# Patient Record
Sex: Female | Born: 1940 | Race: Asian | Hispanic: No | Marital: Married | Smoking: Never smoker
Health system: Southern US, Community
[De-identification: ages and names within clinical notes are randomized; demographics above are authoritative.]

## PROBLEM LIST (undated history)

## (undated) DIAGNOSIS — K219 Gastro-esophageal reflux disease without esophagitis: Secondary | ICD-10-CM

## (undated) DIAGNOSIS — K2970 Gastritis, unspecified, without bleeding: Secondary | ICD-10-CM

## (undated) DIAGNOSIS — M1990 Unspecified osteoarthritis, unspecified site: Secondary | ICD-10-CM

## (undated) DIAGNOSIS — E538 Deficiency of other specified B group vitamins: Secondary | ICD-10-CM

## (undated) HISTORY — PX: ESOPHAGOGASTRODUODENOSCOPY ENDOSCOPY: SHX5814

---

## 1999-10-14 ENCOUNTER — Other Ambulatory Visit: Admission: RE | Admit: 1999-10-14 | Discharge: 1999-10-14 | Payer: Self-pay | Admitting: Obstetrics and Gynecology

## 2007-11-15 ENCOUNTER — Encounter: Admission: RE | Admit: 2007-11-15 | Discharge: 2007-11-15 | Payer: Self-pay | Admitting: Orthopedic Surgery

## 2008-01-27 ENCOUNTER — Encounter: Admission: RE | Admit: 2008-01-27 | Discharge: 2008-01-27 | Payer: Self-pay | Admitting: Internal Medicine

## 2008-04-13 ENCOUNTER — Ambulatory Visit (HOSPITAL_COMMUNITY): Admission: RE | Admit: 2008-04-13 | Discharge: 2008-04-13 | Payer: Self-pay | Admitting: *Deleted

## 2010-08-12 NOTE — Op Note (Signed)
[name], Natalie Mcfarland                 ACCOUNT NO.:  [id number]   MEDICAL RECORD NO.:  [id number]          PATIENT TYPE:  AMB   LOCATION:  ENDO                         FACILITY:  [facility]   PHYSICIAN:  Georgiana Spinner, M.D.    DATE OF BIRTH:  20-Oct-1940   DATE OF PROCEDURE:  04/13/2008  DATE OF DISCHARGE:                               OPERATIVE REPORT   Dr. working on the along the line of U N G T O N G Jul 30[birth year] 1942, 04/04.   PROCEDURE:  Upper endoscopy.   INDICATIONS:  Gastroesophageal reflux disease.   ANESTHESIA:  Fentanyl 50 mcg, Versed 5 mg.   PROCEDURE:  With the patient mildly sedated in the left lateral  decubitus position, the Pentax videoscopic endoscope was inserted into  the mouth, passed under direct vision through the esophagus which  appeared normal, into the stomach.  Fundus, body, antrum appeared  normal, as did duodenal bulb, second portion of duodenum.  From this  point the endoscope was slowly withdrawn, taking circumferential views  of duodenal mucosa until the endoscope was pulled back into stomach,  placed in retroflexion to view the stomach from below.  The endoscope  was straightened and withdrawn, taking circumferential views of  remaining gastric and esophageal mucosa.  The patient's vital signs,  pulse oximeter remained stable.  The patient tolerated procedure well  without apparent complications.   FINDINGS:  Normal examination.   PLAN:  Proceed to colonoscopy.           ______________________________  Georgiana Spinner, M.D.     GMO/MEDQ  D:  04/13/2008  T:  04/13/2008  Job:  [id number]

## 2010-08-12 NOTE — Op Note (Signed)
Natalie Mcfarland, Natalie Mcfarland                 ACCOUNT NO.:  [id number]   MEDICAL RECORD NO.:  [id number]          PATIENT TYPE:  AMB   LOCATION:  ENDO                         FACILITY:  [facility]   PHYSICIAN:  Georgiana Spinner, M.D.    DATE OF BIRTH:  1940-07-02   DATE OF PROCEDURE:  04/13/2008  DATE OF DISCHARGE:                               OPERATIVE REPORT   PROCEDURE:  Colonoscopy.   INDICATIONS:  Colon cancer screening.   ANESTHESIA:  Fentanyl 15 mcg, Versed 1 mg.   PROCEDURE:  With the patient mildly sedated in the left lateral  decubitus position, the Pentax videoscopic pediatric colonoscope was  inserted into the rectum, passed under direct vision to the cecum with  pressure applied and patient rolled to her back.  The cecum was  identified by ileocecal valve and appendiceal orifice.  We entered into  the ileocecal valve and saw the terminal ileum, photographed this.  The  endoscope was withdrawn, taking circumferential views of remaining  colonic mucosa, stopping only in the rectum which appeared normal on  direct and showed hemorrhoids on retroflexed view.  The endoscope was  straightened and withdrawn.  The patient's vital signs, pulse oximeter  remained stable.  The patient tolerated procedure well, without apparent  complications.   FINDINGS:  Internal hemorrhoids.  Otherwise an unremarkable colonoscopic  examination, including terminal ileum.   PLAN:  Have patient follow up with me in 5 years or as needed.           ______________________________  Georgiana Spinner, M.D.     GMO/MEDQ  D:  04/13/2008  T:  04/13/2008  Job:  [id number]

## 2010-11-14 ENCOUNTER — Encounter: Payer: Self-pay | Admitting: Oncology

## 2010-11-17 ENCOUNTER — Other Ambulatory Visit: Payer: Self-pay | Admitting: Gastroenterology

## 2010-11-17 DIAGNOSIS — R1033 Periumbilical pain: Secondary | ICD-10-CM

## 2010-11-25 ENCOUNTER — Other Ambulatory Visit: Payer: Self-pay

## 2010-11-25 ENCOUNTER — Ambulatory Visit
Admission: RE | Admit: 2010-11-25 | Discharge: 2010-11-25 | Disposition: A | Discharge status: Home / Self Care | Payer: Medicare Other | Source: Ambulatory Visit | Attending: Gastroenterology | Admitting: Gastroenterology

## 2010-11-25 DIAGNOSIS — R1033 Periumbilical pain: Secondary | ICD-10-CM

## 2010-11-25 MED ORDER — IOHEXOL 300 MG/ML  SOLN
100.0000 mL | Freq: Once | INTRAMUSCULAR | Status: AC | PRN
  Administered 2010-11-25: 100 mL via INTRAVENOUS

## 2011-01-02 ENCOUNTER — Other Ambulatory Visit: Payer: Self-pay | Admitting: Gastroenterology

## 2011-01-02 ENCOUNTER — Ambulatory Visit (HOSPITAL_COMMUNITY)
Admission: RE | Admit: 2011-01-02 | Discharge: 2011-01-02 | Disposition: A | Discharge status: Home / Self Care | Payer: Medicare Other | Source: Ambulatory Visit | Attending: Gastroenterology | Admitting: Gastroenterology

## 2011-01-02 DIAGNOSIS — R935 Abnormal findings on diagnostic imaging of other abdominal regions, including retroperitoneum: Secondary | ICD-10-CM | POA: Insufficient documentation

## 2011-01-02 DIAGNOSIS — K319 Disease of stomach and duodenum, unspecified: Secondary | ICD-10-CM | POA: Insufficient documentation

## 2011-01-02 DIAGNOSIS — K2940 Chronic atrophic gastritis without bleeding: Secondary | ICD-10-CM | POA: Insufficient documentation

## 2011-01-02 DIAGNOSIS — C8589 Other specified types of non-Hodgkin lymphoma, extranodal and solid organ sites: Secondary | ICD-10-CM | POA: Insufficient documentation

## 2012-07-08 ENCOUNTER — Encounter (HOSPITAL_COMMUNITY): Payer: Self-pay | Admitting: Pharmacy Technician

## 2012-07-08 ENCOUNTER — Encounter (HOSPITAL_COMMUNITY): Payer: Self-pay

## 2012-07-08 ENCOUNTER — Encounter (HOSPITAL_COMMUNITY)
Admission: RE | Admit: 2012-07-08 | Discharge: 2012-07-08 | Disposition: A | Discharge status: Home / Self Care | Payer: Medicare PPO | Source: Ambulatory Visit | Attending: Orthopedic Surgery | Admitting: Orthopedic Surgery

## 2012-07-08 ENCOUNTER — Ambulatory Visit (HOSPITAL_COMMUNITY)
Admission: RE | Admit: 2012-07-08 | Discharge: 2012-07-08 | Disposition: A | Discharge status: Home / Self Care | Payer: Medicare PPO | Source: Ambulatory Visit | Attending: Orthopedic Surgery | Admitting: Orthopedic Surgery

## 2012-07-08 DIAGNOSIS — M1710 Unilateral primary osteoarthritis, unspecified knee: Secondary | ICD-10-CM | POA: Insufficient documentation

## 2012-07-08 DIAGNOSIS — Z01810 Encounter for preprocedural cardiovascular examination: Secondary | ICD-10-CM | POA: Insufficient documentation

## 2012-07-08 DIAGNOSIS — Z01811 Encounter for preprocedural respiratory examination: Secondary | ICD-10-CM | POA: Insufficient documentation

## 2012-07-08 DIAGNOSIS — Z01812 Encounter for preprocedural laboratory examination: Secondary | ICD-10-CM | POA: Insufficient documentation

## 2012-07-08 HISTORY — DX: Gastro-esophageal reflux disease without esophagitis: K21.9

## 2012-07-08 HISTORY — DX: Unspecified osteoarthritis, unspecified site: M19.90

## 2012-07-08 HISTORY — DX: Gastritis, unspecified, without bleeding: K29.70

## 2012-07-08 LAB — COMPREHENSIVE METABOLIC PANEL
ALT: 26 U/L (ref 0–35)
AST: 30 U/L (ref 0–37)
Albumin: 3.6 g/dL (ref 3.5–5.2)
Alkaline Phosphatase: 64 U/L (ref 39–117)
BUN: 12 mg/dL (ref 6–23)
Calcium: 9.2 mg/dL (ref 8.4–10.5)
Chloride: 104 mEq/L (ref 96–112)
GFR calc non Af Amer: >90 mL/min (ref >90)
Potassium: 4.2 mEq/L (ref 3.5–5.1)
Total Protein: 7.2 g/dL (ref 6.0–8.3)

## 2012-07-08 LAB — CBC
HCT: 37.4 % (ref 36.0–46.0)
Hemoglobin: 12.4 g/dL (ref 12.0–15.0)
MCH: 29.5 pg (ref 26.0–34.0)
MCV: 89 fL (ref 78.0–100.0)
RDW: 13.4 % (ref 11.5–15.5)
WBC: 5 10*3/uL (ref 4.0–10.5)

## 2012-07-08 NOTE — Patient Instructions (Signed)
YOUR SURGERY IS SCHEDULED AT [facility]  ON:  Monday  4/28  REPORT TO [place] [facility] AT:  7:30 AM      PHONE # FOR SHORT STAY IS [phone]  DO NOT EAT OR DRINK ANYTHING AFTER MIDNIGHT THE NIGHT BEFORE YOUR SURGERY.  YOU MAY BRUSH YOUR TEETH, RINSE OUT YOUR MOUTH--BUT NO WATER, NO FOOD, NO CHEWING GUM, NO MINTS, NO CANDIES, NO CHEWING TOBACCO.  PLEASE TAKE THE FOLLOWING MEDICATIONS THE AM OF YOUR SURGERY WITH A FEW SIPS OF WATER:  CLARITIN   DO NOT BRING VALUABLES, MONEY, CREDIT CARDS.  DO NOT WEAR JEWELRY, MAKE-UP, NAIL POLISH AND NO METAL PINS OR CLIPS IN YOUR HAIR. CONTACT LENS, DENTURES / PARTIALS, GLASSES SHOULD NOT BE WORN TO SURGERY AND IN MOST CASES-HEARING AIDS WILL NEED TO BE REMOVED.  BRING YOUR GLASSES CASE, ANY EQUIPMENT NEEDED FOR YOUR CONTACT LENS. FOR PATIENTS ADMITTED TO THE HOSPITAL--CHECK OUT TIME THE DAY OF DISCHARGE IS 11:00 AM.  ALL INPATIENT ROOMS ARE PRIVATE - WITH BATHROOM, TELEPHONE, TELEVISION AND WIFI INTERNET.                                PLEASE READ OVER ANY  FACT SHEETS THAT YOU WERE GIVEN: MRSA INFORMATION, BLOOD TRANSFUSION INFORMATION, INCENTIVE SPIROMETER INFORMATION. FAILURE TO FOLLOW THESE INSTRUCTIONS MAY RESULT IN THE CANCELLATION OF YOUR SURGERY.   PATIENT SIGNATURE_________________________________

## 2012-07-08 NOTE — Pre-Procedure Instructions (Addendum)
PT STATES SHE HAS NOT HAD EKG OR CXR DONE IN YEARS--SHE IS HAVING TOTAL KNEE REPLACMENT AND IS 72 YEARS OF AGE --CXR AND EKG WERE DONE TODAY PREOP. NO PREOP ORDERS IN EPIC- SPOKE WITH DR. Lequita Halt - OK TO DO CBC, CMET, PT, PTT, UA TODAY AND ASK DREW PERKINS TO PUT IN OTHER PREOP ORDERS.  DREW PAGED - NO ANSWER - WILL SEND FAXED REQUEST TO OFFICE NEXT WEEK.  PT UNDERSTANDS SHE WILL SIGN CONSENTS FOR SURGERY AND BLOOD - THE DAY OF SURGERY.

## 2012-07-14 ENCOUNTER — Other Ambulatory Visit: Payer: Self-pay | Admitting: Orthopedic Surgery

## 2012-07-14 MED ORDER — BUPIVACAINE LIPOSOME 1.3 % IJ SUSP: 20.0000 mL | Freq: Once | INTRAMUSCULAR | Status: DC

## 2012-07-14 MED ORDER — DEXAMETHASONE SODIUM PHOSPHATE 10 MG/ML IJ SOLN: 10.0000 mg | Freq: Once | INTRAMUSCULAR | Status: DC

## 2012-07-14 NOTE — Progress Notes (Signed)
Preoperative surgical orders have been place into the Epic hospital system for Natalie Mcfarland on 07/14/2012, 12:46 PM  by Patrica Duel for surgery on 07/25/2012.  Preop Total Knee orders including Experal, IV Tylenol, and IV Decadron as long as there are no contraindications to the above medications. Avel Peace, PA-C

## 2012-07-24 ENCOUNTER — Other Ambulatory Visit: Payer: Self-pay | Admitting: Surgical

## 2012-07-24 NOTE — H&P (Signed)
TOTAL KNEE ADMISSION H&P  Patient is being admitted for left total knee arthroplasty.  Subjective:  Chief Complaint:left knee pain.  HPI: Natalie Mcfarland, 72 y.o. female, has a history of pain and functional disability in the left knee due to arthritis and has failed non-surgical conservative treatments for greater than 12 weeks to includeNSAID's and/or analgesics, corticosteriod injections, viscosupplementation injections and activity modification.  Onset of symptoms was gradual, starting >10 years ago with gradually worsening course since that time. The patient noted no past surgery on the left knee(s).  Patient currently rates pain in the left knee(s) at 7 out of 10 with activity. Patient has night pain, worsening of pain with activity and weight bearing, pain that interferes with activities of daily living and crepitus.  Patient has evidence of periarticular osteophytes and joint space narrowing by imaging studies.  There is no active infection.   Past Medical History  Diagnosis Date  . Gastritis     past hx of gastritis -no problem since  . GERD (gastroesophageal reflux disease)   . Arthritis     pain and oa left knee    Past Surgical History  Procedure Laterality Date  . Esophagogastroduodenoscopy endoscopy       Current outpatient prescriptions:fish oil-omega-3 fatty acids 1000 MG capsule, Take 1 g by mouth daily., Disp: , Rfl: ;  ibuprofen (ADVIL,MOTRIN) 200 MG tablet, Take 400 mg by mouth every 6 (six) hours as needed for pain or headache., Disp: , Rfl: ;  loratadine (CLARITIN) 10 MG tablet, Take 10 mg by mouth daily., Disp: , Rfl:   No Known Allergies  History  Substance Use Topics  . Smoking status: Not on file  . Smokeless tobacco: Not on file  . Alcohol Use: Not on file    No pertinent family medical history  Review of Systems  Constitutional: Negative.   HENT: Negative.  Negative for neck pain.   Eyes: Positive for blurred vision. Negative for double vision,  photophobia, pain, discharge and redness.  Respiratory: Negative.   Cardiovascular: Negative.   Gastrointestinal: Negative.   Genitourinary: Negative.   Musculoskeletal: Positive for joint pain. Negative for myalgias, back pain and falls.       Left knee pain  Skin: Positive for itching and rash.  Neurological: Negative.   Endo/Heme/Allergies: Positive for environmental allergies. Negative for polydipsia. Does not bruise/bleed easily.  Psychiatric/Behavioral: Negative.     Objective:  Physical Exam  Constitutional: She is oriented to person, place, and time. She appears well-developed and well-nourished. No distress.  HENT:  Head: Normocephalic and atraumatic.  Right Ear: External ear normal.  Left Ear: External ear normal.  Nose: Nose normal.  Mouth/Throat: Oropharynx is clear and moist.  Eyes: Conjunctivae and EOM are normal.  Neck: Normal range of motion. Neck supple. No tracheal deviation present. No thyromegaly present.  Cardiovascular: Normal rate, regular rhythm, normal heart sounds and intact distal pulses.   No murmur heard. Respiratory: Effort normal and breath sounds normal. No respiratory distress. She has no wheezes. She exhibits no tenderness.  GI: Soft. Bowel sounds are normal. She exhibits no distension and no mass. There is no tenderness.  Musculoskeletal:       Right hip: Normal.       Left hip: Normal.       Right knee: Normal.       Left knee: She exhibits decreased range of motion, effusion and abnormal alignment. She exhibits no erythema. Tenderness found. Medial joint line and lateral joint line tenderness  noted.       Right lower leg: She exhibits no tenderness and no swelling.       Left lower leg: She exhibits no tenderness and no swelling.  The right knee shows no effusion. Range of motion is 0-125. Slight crepitus on range of motion. No joint line tenderness or instability is noted. The left knee shows a varus deformity. Slight effusion. Range is 5-125.  Marked crepitus on range of motion. Significant tenderness medial greater than lateral with no instability.  Lymphadenopathy:    She has no cervical adenopathy.  Neurological: She is alert and oriented to person, place, and time. She has normal strength and normal reflexes. No sensory deficit.  Skin: No rash noted. She is not diaphoretic. No erythema.  Psychiatric: She has a normal mood and affect. Her behavior is normal.    Vitals Weight: 171 lb Height: 62 in Body Surface Area: 1.84 m Body Mass Index: 31.28 kg/m Pulse: 66 (Regular) BP: 124/78 (Sitting, Left Arm, Standard)  Imaging Review Plain radiographs demonstrate severe degenerative joint disease of the left knee(s). The overall alignment ismild varus. The bone quality appears to be good for age and reported activity level.  Assessment/Plan:  End stage arthritis, left knee   The patient history, physical examination, clinical judgment of the provider and imaging studies are consistent with end stage degenerative joint disease of the left knee(s) and total knee arthroplasty is deemed medically necessary. The treatment options including medical management, injection therapy arthroscopy and arthroplasty were discussed at length. The risks and benefits of total knee arthroplasty were presented and reviewed. The risks due to aseptic loosening, infection, stiffness, patella tracking problems, thromboembolic complications and other imponderables were discussed. The patient acknowledged the explanation, agreed to proceed with the plan and consent was signed. Patient is being admitted for inpatient treatment for surgery, pain control, PT, OT, prophylactic antibiotics, VTE prophylaxis, progressive ambulation and ADL's and discharge planning. The patient is planning to be discharged to skilled nursing facility     [place], [place]

## 2012-07-25 ENCOUNTER — Encounter (HOSPITAL_COMMUNITY)
Admission: RE | Disposition: A | Discharge status: Home Health | Payer: Self-pay | Source: Ambulatory Visit | Attending: Orthopedic Surgery

## 2012-07-25 ENCOUNTER — Inpatient Hospital Stay (HOSPITAL_COMMUNITY): Payer: Medicare PPO | Admitting: Anesthesiology

## 2012-07-25 ENCOUNTER — Encounter (HOSPITAL_COMMUNITY): Payer: Self-pay | Admitting: Anesthesiology

## 2012-07-25 ENCOUNTER — Inpatient Hospital Stay (HOSPITAL_COMMUNITY)
Admission: RE | Admit: 2012-07-25 | Discharge: 2012-07-28 | DRG: 470 | Disposition: A | Discharge status: Home Health | Payer: Medicare PPO | Source: Ambulatory Visit | Attending: Orthopedic Surgery | Admitting: Orthopedic Surgery

## 2012-07-25 ENCOUNTER — Encounter (HOSPITAL_COMMUNITY): Payer: Self-pay | Admitting: *Deleted

## 2012-07-25 DIAGNOSIS — Z96652 Presence of left artificial knee joint: Secondary | ICD-10-CM

## 2012-07-25 DIAGNOSIS — K219 Gastro-esophageal reflux disease without esophagitis: Secondary | ICD-10-CM | POA: Diagnosis present

## 2012-07-25 DIAGNOSIS — M1710 Unilateral primary osteoarthritis, unspecified knee: Principal | ICD-10-CM | POA: Diagnosis present

## 2012-07-25 DIAGNOSIS — E876 Hypokalemia: Secondary | ICD-10-CM

## 2012-07-25 DIAGNOSIS — Z79899 Other long term (current) drug therapy: Secondary | ICD-10-CM

## 2012-07-25 HISTORY — PX: TOTAL KNEE ARTHROPLASTY: SHX125

## 2012-07-25 LAB — URINALYSIS, ROUTINE W REFLEX MICROSCOPIC
Bilirubin Urine: NEGATIVE
Ketones, ur: NEGATIVE mg/dL
Nitrite: NEGATIVE
Urobilinogen, UA: 0.2 mg/dL (ref 0.0–1.0)
pH: 5.5 (ref 5.0–8.0)

## 2012-07-25 LAB — URINE MICROSCOPIC-ADD ON

## 2012-07-25 LAB — TYPE AND SCREEN

## 2012-07-25 SURGERY — ARTHROPLASTY, KNEE, TOTAL
Anesthesia: Spinal | Site: Knee | Laterality: Left | Wound class: Clean

## 2012-07-25 MED ORDER — OXYCODONE HCL 5 MG PO TABS: 5.0000 mg | ORAL_TABLET | Freq: Once | ORAL | Status: DC | PRN

## 2012-07-25 MED ORDER — PROPOFOL 10 MG/ML IV EMUL
INTRAVENOUS | Status: DC | PRN
  Administered 2012-07-25: 100 ug/kg/min via INTRAVENOUS

## 2012-07-25 MED ORDER — ONDANSETRON HCL 4 MG/2ML IJ SOLN
INTRAMUSCULAR | Status: DC | PRN
  Administered 2012-07-25: 4 mg via INTRAVENOUS

## 2012-07-25 MED ORDER — DEXAMETHASONE 6 MG PO TABS
10.0000 mg | ORAL_TABLET | Freq: Every day | ORAL | Status: AC
  Administered 2012-07-26: 10 mg via ORAL
  Filled 2012-07-25: qty 1

## 2012-07-25 MED ORDER — BUPIVACAINE IN DEXTROSE 0.75-8.25 % IT SOLN
INTRATHECAL | Status: DC | PRN
  Administered 2012-07-25: 1.5 mL via INTRATHECAL

## 2012-07-25 MED ORDER — ACETAMINOPHEN 10 MG/ML IV SOLN
1000.0000 mg | Freq: Four times a day (QID) | INTRAVENOUS | Status: AC
  Administered 2012-07-25 – 2012-07-26 (×4): 1000 mg via INTRAVENOUS
  Filled 2012-07-25 (×6): qty 100

## 2012-07-25 MED ORDER — LACTATED RINGERS IV SOLN
INTRAVENOUS | Status: DC | PRN
  Administered 2012-07-25 (×2): via INTRAVENOUS

## 2012-07-25 MED ORDER — DIPHENHYDRAMINE HCL 12.5 MG/5ML PO ELIX: 12.5000 mg | ORAL_SOLUTION | ORAL | Status: DC | PRN

## 2012-07-25 MED ORDER — DEXAMETHASONE SODIUM PHOSPHATE 10 MG/ML IJ SOLN
10.0000 mg | Freq: Every day | INTRAMUSCULAR | Status: AC
  Filled 2012-07-25: qty 1

## 2012-07-25 MED ORDER — ACETAMINOPHEN 10 MG/ML IV SOLN: 1000.0000 mg | Freq: Once | INTRAVENOUS | Status: DC | PRN

## 2012-07-25 MED ORDER — CEFAZOLIN SODIUM-DEXTROSE 2-3 GM-% IV SOLR
2.0000 g | INTRAVENOUS | Status: AC
  Administered 2012-07-25: 2 g via INTRAVENOUS

## 2012-07-25 MED ORDER — LACTATED RINGERS IV SOLN
INTRAVENOUS | Status: DC
  Administered 2012-07-25: 13:00:00 via INTRAVENOUS

## 2012-07-25 MED ORDER — ACETAMINOPHEN 650 MG RE SUPP: 650.0000 mg | Freq: Four times a day (QID) | RECTAL | Status: DC | PRN

## 2012-07-25 MED ORDER — SODIUM CHLORIDE 0.9 % IJ SOLN
INTRAMUSCULAR | Status: DC | PRN
  Administered 2012-07-25: 11:00:00

## 2012-07-25 MED ORDER — OXYCODONE HCL 5 MG PO TABS
5.0000 mg | ORAL_TABLET | ORAL | Status: DC | PRN
  Administered 2012-07-25: 10 mg via ORAL
  Administered 2012-07-26: 5 mg via ORAL
  Administered 2012-07-26 – 2012-07-28 (×9): 10 mg via ORAL
  Filled 2012-07-25 (×3): qty 2
  Filled 2012-07-25: qty 1
  Filled 2012-07-25 (×7): qty 2

## 2012-07-25 MED ORDER — PHENOL 1.4 % MT LIQD: 1.0000 | OROMUCOSAL | Status: DC | PRN

## 2012-07-25 MED ORDER — FLEET ENEMA 7-19 GM/118ML RE ENEM: 1.0000 | ENEMA | Freq: Once | RECTAL | Status: AC | PRN

## 2012-07-25 MED ORDER — PROMETHAZINE HCL 25 MG/ML IJ SOLN: 6.2500 mg | INTRAMUSCULAR | Status: DC | PRN

## 2012-07-25 MED ORDER — MORPHINE SULFATE 2 MG/ML IJ SOLN: 1.0000 mg | INTRAMUSCULAR | Status: DC | PRN

## 2012-07-25 MED ORDER — TRAMADOL HCL 50 MG PO TABS
50.0000 mg | ORAL_TABLET | Freq: Four times a day (QID) | ORAL | Status: DC | PRN
  Administered 2012-07-25: 100 mg via ORAL
  Filled 2012-07-25: qty 2

## 2012-07-25 MED ORDER — HYDROMORPHONE HCL PF 1 MG/ML IJ SOLN: 0.2500 mg | INTRAMUSCULAR | Status: DC | PRN

## 2012-07-25 MED ORDER — POLYETHYLENE GLYCOL 3350 17 G PO PACK: 17.0000 g | PACK | Freq: Every day | ORAL | Status: DC | PRN

## 2012-07-25 MED ORDER — METOCLOPRAMIDE HCL 10 MG PO TABS: 5.0000 mg | ORAL_TABLET | Freq: Three times a day (TID) | ORAL | Status: DC | PRN

## 2012-07-25 MED ORDER — ONDANSETRON HCL 4 MG PO TABS: 4.0000 mg | ORAL_TABLET | Freq: Four times a day (QID) | ORAL | Status: DC | PRN

## 2012-07-25 MED ORDER — METHOCARBAMOL 500 MG PO TABS
500.0000 mg | ORAL_TABLET | Freq: Four times a day (QID) | ORAL | Status: DC | PRN
  Administered 2012-07-25 – 2012-07-28 (×4): 500 mg via ORAL
  Filled 2012-07-25 (×4): qty 1

## 2012-07-25 MED ORDER — FENTANYL CITRATE 0.05 MG/ML IJ SOLN
INTRAMUSCULAR | Status: DC | PRN
  Administered 2012-07-25: 100 ug via INTRAVENOUS

## 2012-07-25 MED ORDER — BUPIVACAINE LIPOSOME 1.3 % IJ SUSP
20.0000 mL | Freq: Once | INTRAMUSCULAR | Status: DC
  Filled 2012-07-25: qty 20

## 2012-07-25 MED ORDER — SODIUM CHLORIDE 0.9 % IR SOLN
Status: DC | PRN
  Administered 2012-07-25: 1000 mL

## 2012-07-25 MED ORDER — METHOCARBAMOL 100 MG/ML IJ SOLN: 500.0000 mg | Freq: Four times a day (QID) | INTRAVENOUS | Status: DC | PRN

## 2012-07-25 MED ORDER — METOCLOPRAMIDE HCL 5 MG/ML IJ SOLN
5.0000 mg | Freq: Three times a day (TID) | INTRAMUSCULAR | Status: DC | PRN
  Administered 2012-07-26: 10 mg via INTRAVENOUS
  Filled 2012-07-25: qty 2

## 2012-07-25 MED ORDER — DOCUSATE SODIUM 100 MG PO CAPS
100.0000 mg | ORAL_CAPSULE | Freq: Two times a day (BID) | ORAL | Status: DC
  Administered 2012-07-25 – 2012-07-28 (×5): 100 mg via ORAL

## 2012-07-25 MED ORDER — ACETAMINOPHEN 325 MG PO TABS
650.0000 mg | ORAL_TABLET | Freq: Four times a day (QID) | ORAL | Status: DC | PRN
  Administered 2012-07-27: 650 mg via ORAL
  Filled 2012-07-25: qty 2

## 2012-07-25 MED ORDER — SODIUM CHLORIDE 0.9 % IV SOLN: INTRAVENOUS | Status: DC

## 2012-07-25 MED ORDER — MIDAZOLAM HCL 5 MG/5ML IJ SOLN
INTRAMUSCULAR | Status: DC | PRN
  Administered 2012-07-25 (×2): 1 mg via INTRAVENOUS

## 2012-07-25 MED ORDER — ONDANSETRON HCL 4 MG/2ML IJ SOLN
4.0000 mg | Freq: Four times a day (QID) | INTRAMUSCULAR | Status: DC | PRN
  Administered 2012-07-26 (×2): 4 mg via INTRAVENOUS
  Filled 2012-07-25 (×2): qty 2

## 2012-07-25 MED ORDER — TRANEXAMIC ACID 100 MG/ML IV SOLN
1000.0000 mg | INTRAVENOUS | Status: AC
  Administered 2012-07-25: 1000 mg via INTRAVENOUS
  Filled 2012-07-25: qty 10

## 2012-07-25 MED ORDER — ACETAMINOPHEN 10 MG/ML IV SOLN
1000.0000 mg | Freq: Once | INTRAVENOUS | Status: AC
  Administered 2012-07-25: 1000 mg via INTRAVENOUS

## 2012-07-25 MED ORDER — OXYCODONE HCL 5 MG/5ML PO SOLN
5.0000 mg | Freq: Once | ORAL | Status: DC | PRN
  Filled 2012-07-25: qty 5

## 2012-07-25 MED ORDER — CHLORHEXIDINE GLUCONATE 4 % EX LIQD: 60.0000 mL | Freq: Once | CUTANEOUS | Status: DC

## 2012-07-25 MED ORDER — BUPIVACAINE HCL (PF) 0.25 % IJ SOLN
INTRAMUSCULAR | Status: DC | PRN
  Administered 2012-07-25: 20 mL

## 2012-07-25 MED ORDER — CEFAZOLIN SODIUM 1-5 GM-% IV SOLN
1.0000 g | Freq: Four times a day (QID) | INTRAVENOUS | Status: AC
  Administered 2012-07-25 (×2): 1 g via INTRAVENOUS
  Filled 2012-07-25 (×2): qty 50

## 2012-07-25 MED ORDER — MEPERIDINE HCL 50 MG/ML IJ SOLN: 6.2500 mg | INTRAMUSCULAR | Status: DC | PRN

## 2012-07-25 MED ORDER — BISACODYL 10 MG RE SUPP: 10.0000 mg | Freq: Every day | RECTAL | Status: DC | PRN

## 2012-07-25 MED ORDER — DEXTROSE-NACL 5-0.9 % IV SOLN
INTRAVENOUS | Status: DC
  Administered 2012-07-25: 17:00:00 via INTRAVENOUS

## 2012-07-25 MED ORDER — MENTHOL 3 MG MT LOZG: 1.0000 | LOZENGE | OROMUCOSAL | Status: DC | PRN

## 2012-07-25 MED ORDER — LORATADINE 10 MG PO TABS
10.0000 mg | ORAL_TABLET | Freq: Every day | ORAL | Status: DC
  Administered 2012-07-26 – 2012-07-28 (×3): 10 mg via ORAL
  Filled 2012-07-25 (×3): qty 1

## 2012-07-25 MED ORDER — 0.9 % SODIUM CHLORIDE (POUR BTL) OPTIME
TOPICAL | Status: DC | PRN
  Administered 2012-07-25: 1000 mL

## 2012-07-25 MED ORDER — RIVAROXABAN 10 MG PO TABS
10.0000 mg | ORAL_TABLET | Freq: Every day | ORAL | Status: DC
  Administered 2012-07-26 – 2012-07-28 (×3): 10 mg via ORAL
  Filled 2012-07-25 (×5): qty 1

## 2012-07-25 SURGICAL SUPPLY — 53 items
BAG ZIPLOCK 12X15 (MISCELLANEOUS) ×2 IMPLANT
BANDAGE ELASTIC 6 VELCRO ST LF (GAUZE/BANDAGES/DRESSINGS) ×2 IMPLANT
BANDAGE ESMARK 6X9 LF (GAUZE/BANDAGES/DRESSINGS) ×1 IMPLANT
BLADE SAG 18X100X1.27 (BLADE) ×2 IMPLANT
BLADE SAW SGTL 11.0X1.19X90.0M (BLADE) ×2 IMPLANT
BNDG ESMARK 6X9 LF (GAUZE/BANDAGES/DRESSINGS) ×2
BOWL SMART MIX CTS (DISPOSABLE) ×2 IMPLANT
CEMENT HV SMART SET (Cement) ×4 IMPLANT
CLOTH BEACON ORANGE TIMEOUT ST (SAFETY) ×2 IMPLANT
CUFF TOURN SGL QUICK 34 (TOURNIQUET CUFF) ×1
CUFF TRNQT CYL 34X4X40X1 (TOURNIQUET CUFF) ×1 IMPLANT
DRAPE EXTREMITY T 121X128X90 (DRAPE) ×2 IMPLANT
DRAPE POUCH INSTRU U-SHP 10X18 (DRAPES) ×2 IMPLANT
DRAPE U-SHAPE 47X51 STRL (DRAPES) ×2 IMPLANT
DRSG ADAPTIC 3X8 NADH LF (GAUZE/BANDAGES/DRESSINGS) ×2 IMPLANT
DRSG PAD ABDOMINAL 8X10 ST (GAUZE/BANDAGES/DRESSINGS) ×2 IMPLANT
DURAPREP 26ML APPLICATOR (WOUND CARE) ×2 IMPLANT
ELECT REM PT RETURN 9FT ADLT (ELECTROSURGICAL) ×2
ELECTRODE REM PT RTRN 9FT ADLT (ELECTROSURGICAL) ×1 IMPLANT
EVACUATOR 1/8 PVC DRAIN (DRAIN) ×2 IMPLANT
FACESHIELD LNG OPTICON STERILE (SAFETY) ×8 IMPLANT
GLOVE BIO SURGEON STRL SZ7.5 (GLOVE) ×2 IMPLANT
GLOVE BIO SURGEON STRL SZ8 (GLOVE) ×2 IMPLANT
GLOVE BIOGEL PI IND STRL 8 (GLOVE) ×2 IMPLANT
GLOVE BIOGEL PI INDICATOR 8 (GLOVE) ×2
GLOVE SURG SS PI 6.5 STRL IVOR (GLOVE) ×4 IMPLANT
GOWN STRL NON-REIN LRG LVL3 (GOWN DISPOSABLE) ×6 IMPLANT
GOWN STRL REIN XL XLG (GOWN DISPOSABLE) ×2 IMPLANT
HANDPIECE INTERPULSE COAX TIP (DISPOSABLE) ×1
IMMOBILIZER KNEE 20 (SOFTGOODS) ×2
IMMOBILIZER KNEE 20 THIGH 36 (SOFTGOODS) ×1 IMPLANT
KIT BASIN OR (CUSTOM PROCEDURE TRAY) ×2 IMPLANT
MANIFOLD NEPTUNE II (INSTRUMENTS) ×2 IMPLANT
NDL SAFETY ECLIPSE 18X1.5 (NEEDLE) ×1 IMPLANT
NEEDLE HYPO 18GX1.5 SHARP (NEEDLE) ×1
NS IRRIG 1000ML POUR BTL (IV SOLUTION) ×2 IMPLANT
PACK TOTAL JOINT (CUSTOM PROCEDURE TRAY) ×2 IMPLANT
PAD ABD 7.5X8 STRL (GAUZE/BANDAGES/DRESSINGS) ×2 IMPLANT
PADDING CAST COTTON 6X4 STRL (CAST SUPPLIES) ×4 IMPLANT
POSITIONER SURGICAL ARM (MISCELLANEOUS) ×2 IMPLANT
SET HNDPC FAN SPRY TIP SCT (DISPOSABLE) ×1 IMPLANT
SPONGE GAUZE 4X4 12PLY (GAUZE/BANDAGES/DRESSINGS) ×2 IMPLANT
STRIP CLOSURE SKIN 1/2X4 (GAUZE/BANDAGES/DRESSINGS) ×4 IMPLANT
SUCTION FRAZIER 12FR DISP (SUCTIONS) ×2 IMPLANT
SUT MNCRL AB 4-0 PS2 18 (SUTURE) ×2 IMPLANT
SUT VIC AB 2-0 CT1 27 (SUTURE) ×3
SUT VIC AB 2-0 CT1 TAPERPNT 27 (SUTURE) ×3 IMPLANT
SUT VLOC 180 0 24IN GS25 (SUTURE) ×2 IMPLANT
SYR 50ML LL SCALE MARK (SYRINGE) ×2 IMPLANT
TOWEL OR 17X26 10 PK STRL BLUE (TOWEL DISPOSABLE) ×4 IMPLANT
TRAY FOLEY CATH 14FRSI W/METER (CATHETERS) ×2 IMPLANT
WATER STERILE IRR 1500ML POUR (IV SOLUTION) ×4 IMPLANT
WRAP KNEE MAXI GEL POST OP (GAUZE/BANDAGES/DRESSINGS) ×2 IMPLANT

## 2012-07-25 NOTE — Op Note (Signed)
Pre-operative diagnosis- Osteoarthritis  Left knee(s)  Post-operative diagnosis- Osteoarthritis Left knee(s)  Procedure-  Left  Total Knee Arthroplasty  Surgeon- Natalie Mcfarland. Natalie Bradt, MD  Assistant- Natalie Peace, PA-C   Anesthesia-  Spinal EBL-* No blood loss amount entered *  Drains Hemovac  Tourniquet time-  Total Tourniquet Time Documented: Thigh (Left) - 34 minutes Total: Thigh (Left) - 34 minutes    Complications- None  Condition-PACU - hemodynamically stable.   Brief Clinical Note  Natalie Mcfarland is a 72 y.o. year old female with end stage OA of her left knee with progressively worsening pain and dysfunction. She has constant pain, with activity and at rest and significant functional deficits with difficulties even with ADLs. She has had extensive non-op management including analgesics, injections of cortisone, and home exercise program, but remains in significant pain with significant dysfunction. Radiographs show bone on bone arthritis medial and patellofemoral. She presents now for left Total Knee Arthroplasty.    Procedure in detail---   The patient is brought into the operating room and positioned supine on the operating table. After successful administration of  Spinal,   a tourniquet is placed high on the  Left thigh(s) and the lower extremity is prepped and draped in the usual sterile fashion. Time out is performed by the operating team and then the  Left lower extremity is wrapped in Esmarch, knee flexed and the tourniquet inflated to 300 mmHg.       A midline incision is made with a ten blade through the subcutaneous tissue to the level of the extensor mechanism. A fresh blade is used to make a medial parapatellar arthrotomy. Soft tissue over the proximal medial tibia is subperiosteally elevated to the joint line with a knife and into the semimembranosus bursa with a Cobb elevator. Soft tissue over the proximal lateral tibia is elevated with attention being paid to avoiding the  patellar tendon on the tibial tubercle. The patella is everted, knee flexed 90 degrees and the ACL and PCL are removed. Findings are bone on bone medial and patellofemoral with medial osteophytes.        The drill is used to create a starting hole in the distal femur and the canal is thoroughly irrigated with sterile saline to remove the fatty contents. The 5 degree Left  valgus alignment guide is placed into the femoral canal and the distal femoral cutting block is pinned to remove 10 mm off the distal femur. Resection is made with an oscillating saw.      The tibia is subluxed forward and the menisci are removed. The extramedullary alignment guide is placed referencing proximally at the medial aspect of the tibial tubercle and distally along the second metatarsal axis and tibial crest. The block is pinned to remove 2mm off the more deficient medial  side. Resection is made with an oscillating saw. Size 2is the most appropriate size for the tibia and the proximal tibia is prepared with the modular drill and keel punch for that size.      The femoral sizing guide is placed and size 2.5 is most appropriate. Rotation is marked off the epicondylar axis and confirmed by creating a rectangular flexion gap at 90 degrees. The size 2.5 cutting block is pinned in this rotation and the anterior, posterior and chamfer cuts are made with the oscillating saw. The intercondylar block is then placed and that cut is made.      Trial size 2 tibial component, trial size 2.5 posterior stabilized femur and  a 12.5  mm posterior stabilized rotating platform insert trial is placed. Full extension is achieved with excellent varus/valgus and anterior/posterior balance throughout full range of motion. The patella is everted and thickness measured to be 22  mm. Free hand resection is taken to 12 mm, a 35 template is placed, lug holes are drilled, trial patella is placed, and it tracks normally. Osteophytes are removed off the posterior  femur with the trial in place. All trials are removed and the cut bone surfaces prepared with pulsatile lavage. Cement is mixed and once ready for implantation, the size 2 tibial implant, size  2.5 posterior stabilized femoral component, and the size 35 patella are cemented in place and the patella is held with the clamp. The trial insert is placed and the knee held in full extension. The Exparel (20 ml mixed with 50 ml saline) is injected into the extensor mechanism, posterior capsule, medial and lateral gutters and subcutaneous tissues.  All extruded cement is removed and once the cement is hard the permanent 12.5 mm posterior stabilized rotating platform insert is placed into the tibial tray.      The wound is copiously irrigated with saline solution and the extensor mechanism closed over a hemovac drain with #1 PDS suture. The tourniquet is released for a total tourniquet time of 35  minutes. Flexion against gravity is 140 degrees and the patella tracks normally. Subcutaneous tissue is closed with 2.0 vicryl and subcuticular with running 4.0 Monocryl. The incision is cleaned and dried and steri-strips and a bulky sterile dressing are applied. The limb is placed into a knee immobilizer and the patient is awakened and transported to recovery in stable condition.      Please note that a surgical assistant was a medical necessity for this procedure in order to perform it in a safe and expeditious manner. Surgical assistant was necessary to retract the ligaments and vital neurovascular structures to prevent injury to them and also necessary for proper positioning of the limb to allow for anatomic placement of the prosthesis.   Natalie Mcfarland Natalie Barnard, MD    07/25/2012, 11:29 AM

## 2012-07-25 NOTE — Anesthesia Postprocedure Evaluation (Signed)
Anesthesia Post Note  Patient: Natalie Mcfarland  Procedure(s) Performed: Procedure(s) (LRB): TOTAL KNEE ARTHROPLASTY (Left)  Anesthesia type: Spinal  Patient location: PACU  Post pain: Pain level controlled  Post assessment: Post-op Vital signs reviewed  Last Vitals: BP 121/69  Pulse 50  Temp(Src) 36.3 C (Oral)  Resp 13  SpO2 100%  Post vital signs: Reviewed  Level of consciousness: sedated  Complications: No apparent anesthesia complications

## 2012-07-25 NOTE — Interval H&P Note (Signed)
History and Physical Interval Note:  07/25/2012 9:38 AM  Natalie Mcfarland  has presented today for surgery, with the diagnosis of Osteoartritis of the Left Knee  The various methods of treatment have been discussed with the patient and family. After consideration of risks, benefits and other options for treatment, the patient has consented to  Procedure(s): TOTAL KNEE ARTHROPLASTY (Left) as a surgical intervention .  The patient's history has been reviewed, patient examined, no change in status, stable for surgery.  I have reviewed the patient's chart and labs.  Questions were answered to the patient's satisfaction.     Loanne Drilling

## 2012-07-25 NOTE — Plan of Care (Signed)
Problem: Consults Goal: Diagnosis- Total Joint Replacement Left total knee     

## 2012-07-25 NOTE — Anesthesia Procedure Notes (Signed)
Spinal  Patient location during procedure: OR End time: 07/25/2012 10:32 AM Staffing CRNA/Resident: Enriqueta Shutter Performed by: resident/CRNA  Preanesthetic Checklist Completed: patient identified, site marked, surgical consent, pre-op evaluation, timeout performed, IV checked, risks and benefits discussed and monitors and equipment checked Spinal Block Patient position: sitting Prep: Betadine Patient monitoring: continuous pulse ox and blood pressure Approach: midline Location: L3-4 Injection technique: single-shot Needle Needle type: Sprotte and Pencil-Tip  Needle gauge: 24 G Needle length: 9 cm Assessment Sensory level: T6 Additional Notes Expiration date of kit checked and confirmed. Patient tolerated procedure well, without complications.

## 2012-07-25 NOTE — Anesthesia Preprocedure Evaluation (Addendum)
Anesthesia Evaluation  Patient identified by MRN, date of birth, ID band Patient awake    Reviewed: Allergy & Precautions, H&P , NPO status , Patient's Chart, lab work & pertinent test results  Airway Mallampati: II TM Distance: >3 FB Neck ROM: Full    Dental  (+) Dental Advisory Given and Teeth Intact   Pulmonary neg pulmonary ROS,  breath sounds clear to auscultation        Cardiovascular negative cardio ROS  Rhythm:Regular Rate:Normal     Neuro/Psych negative neurological ROS  negative psych ROS   GI/Hepatic Neg liver ROS, GERD-  Medicated,  Endo/Other  negative endocrine ROS  Renal/GU negative Renal ROS     Musculoskeletal negative musculoskeletal ROS (+)   Abdominal   Peds  Hematology negative hematology ROS (+)   Anesthesia Other Findings   Reproductive/Obstetrics                          Anesthesia Physical Anesthesia Plan  ASA: II  Anesthesia Plan: Spinal   Post-op Pain Management:    Induction: Intravenous  Airway Management Planned:   Additional Equipment:   Intra-op Plan:   Post-operative Plan:   Informed Consent: I have reviewed the patients History and Physical, chart, labs and discussed the procedure including the risks, benefits and alternatives for the proposed anesthesia with the patient or authorized representative who has indicated his/her understanding and acceptance.   Dental advisory given  Plan Discussed with: CRNA  Anesthesia Plan Comments:        Anesthesia Quick Evaluation

## 2012-07-25 NOTE — Progress Notes (Signed)
Utilization review completed.  

## 2012-07-25 NOTE — Transfer of Care (Signed)
Immediate Anesthesia Transfer of Care Note  Patient: Natalie Mcfarland  Procedure(s) Performed: Procedure(s): TOTAL KNEE ARTHROPLASTY (Left)  Patient Location: PACU  Anesthesia Type:Regional  Level of Consciousness: awake, alert  and oriented  Airway & Oxygen Therapy: Patient Spontanous Breathing and Patient connected to face mask oxygen  Post-op Assessment: Report given to PACU RN and Post -op Vital signs reviewed and stable  Post vital signs: Reviewed and stable  Complications: No apparent anesthesia complications

## 2012-07-26 ENCOUNTER — Encounter (HOSPITAL_COMMUNITY): Payer: Self-pay | Admitting: Orthopedic Surgery

## 2012-07-26 LAB — CBC
MCHC: 33.1 g/dL (ref 30.0–36.0)
Platelets: 172 10*3/uL (ref 150–400)
RDW: 13.5 % (ref 11.5–15.5)
WBC: 7.4 10*3/uL (ref 4.0–10.5)

## 2012-07-26 LAB — BASIC METABOLIC PANEL
Chloride: 102 mEq/L (ref 96–112)
GFR calc Af Amer: >90 mL/min (ref >90)
GFR calc non Af Amer: >90 mL/min (ref >90)
Potassium: 3.8 mEq/L (ref 3.5–5.1)
Sodium: 135 mEq/L (ref 135–145)

## 2012-07-26 MED ORDER — METHOCARBAMOL 500 MG PO TABS: 500.0000 mg | ORAL_TABLET | Freq: Four times a day (QID) | ORAL | Status: DC | PRN

## 2012-07-26 MED ORDER — RIVAROXABAN 10 MG PO TABS: 10.0000 mg | ORAL_TABLET | Freq: Every day | ORAL | Status: DC

## 2012-07-26 MED ORDER — TRAMADOL HCL 50 MG PO TABS: 50.0000 mg | ORAL_TABLET | Freq: Four times a day (QID) | ORAL | Status: DC | PRN

## 2012-07-26 MED ORDER — OXYCODONE HCL 5 MG PO TABS: 5.0000 mg | ORAL_TABLET | ORAL | Status: DC | PRN

## 2012-07-26 NOTE — Clinical Documentation Improvement (Signed)
GENERIC DOCUMENTATION CLARIFICATION QUERY  THIS DOCUMENT IS NOT A PERMANENT PART OF THE MEDICAL RECORD  TO RESPOND TO THE THIS QUERY, FOLLOW THE INSTRUCTIONS BELOW:  1. If needed, update documentation for the patient's encounter via the notes activity.  2. Access this query again and click edit on the In [employer].  3. After updating, or not, click F2 to complete all highlighted (required) fields concerning your review. Select "additional documentation in the medical record" OR "no additional documentation provided".  4. Click Sign note button.  5. The deficiency will fall out of your In Basket *Please let us know if you are not able to complete this workflow by phone or e-mail (listed below).  Please update your documentation within the medical record to reflect your response to this query.                                                                                        07/26/12   Dear Natalie Mcfarland[facility] Associates,  In a better effort to capture your patient's severity of illness, reflect appropriate length of stay and utilization of resources, a review of the patient medical record has revealed the following indicators.    Based on your clinical judgment, please clarify and document in a progress note and/or discharge summary the clinical condition associated with the following supporting information:  In responding to this query please exercise your independent judgment.  The fact that a query is asked, does not imply that any particular answer is desired or expected.  Clarification Needed   Please clarify the underlying cause/diagnosis responsible for the abnormal U/A and document in pn or d/c summary     Possible Clinical Conditions?  ____________________  ____________________ ____________________ _______Other Condition__________________ _______Cannot Clinically Determine   Supporting Information: L TKA, OA Risk Factors:  Signs & Symptoms:acetaminophen  (OFIRMEV) IV 1,000 mg  acetaminophen (OFIRMEV) IV 1,000 mg    Diagnostics: 07/26/12 Temp: 99.2  Component     Latest Ref Rng 07/25/2012         7:45 AM  Color, Urine     YELLOW AMBER (A)   Component     Latest Ref Rng 07/25/2012         7:45 AM  Leukocytes, UA     NEGATIVE MODERATE (A)   Treatment ceFAZolin (ANCEF) IVPB 1 g/50 mL premix  acetaminophen (OFIRMEV) IV 1,000 mg    You may use possible, probable, or suspect with inpatient documentation. possible, probable, suspected diagnoses MUST be documented at the time of discharge  Reviewed:  no additional documentation provided ljh  Thank You,  Enis Slipper  RN, BSN, MSN/Inf, CCDS Clinical Documentation Specialist [facility] Dept Pager: [phone] / E-mail: [email or web address]  Health Information Management [place]

## 2012-07-26 NOTE — Progress Notes (Signed)
Pt has chosen [facility] to provide HHPT services.

## 2012-07-26 NOTE — Progress Notes (Signed)
Received orders for rw and commode.  Patient is Natalie Mcfarland and is contracted with [employer] for DME needs.

## 2012-07-26 NOTE — Evaluation (Signed)
Physical Therapy Evaluation Patient Details Name: Natalie Mcfarland MRN: [id number] DOB: 07/17/1940 Today's Date: 07/26/2012 Time: 4098-1191 PT Time Calculation (min): 28 min  PT Assessment / Plan / Recommendation Clinical Impression  Pt presents s/p L TKA POD 1 with decreased strength, ROM and mobility.  Tolerated OOB and some ambulation, however became more dizzy and nauseated with being upright.  Pt will benefit from skilled PT in acute venue to address defictis.  PT recommends HHPT for follow up at D/C to maximize pts safety and function.     PT Assessment  Patient needs continued PT services    Follow Up Recommendations  Home health PT    Does the patient have the potential to tolerate intense rehabilitation      Barriers to Discharge None      Equipment Recommendations  Rolling walker with 5" wheels;Other (comment) (bedside commode)    Recommendations for Other Services OT consult   Frequency 7X/week    Precautions / Restrictions Precautions Precautions: Fall;Knee Required Braces or Orthoses: Knee Immobilizer - Left Knee Immobilizer - Left: Discontinue once straight leg raise with < 10 degree lag Restrictions Weight Bearing Restrictions: No Other Position/Activity Restrictions: WBAT   Pertinent Vitals/Pain 3/10 pain, ice packs applied      Mobility  Bed Mobility Bed Mobility: Supine to Sit Supine to Sit: 4: Min assist;HOB elevated Details for Bed Mobility Assistance: Assist for LLE out of bed with min cues for hand placement.   Transfers Transfers: Sit to Stand;Stand to Sit Sit to Stand: 4: Min assist;With upper extremity assist;From bed Stand to Sit: 4: Min assist;With upper extremity assist;With armrests;To chair/3-in-1 Details for Transfer Assistance: Assist to rise and steady with cues for hand placement and LE management.  Also cues for putting weight through LLE when in standing.  Ambulation/Gait Ambulation/Gait Assistance: 4: Min assist Ambulation Distance  (Feet): 10 Feet Assistive device: Rolling walker Ambulation/Gait Assistance Details: Max cues for sequencing/technique with RW and to maintain upright posture.  Pt somewhat impulsive with mobility, but able to correct with cues and assist for RW. Unable to ambulate further than 10' due to increased nausea/dizziness with being upright.  Gait Pattern: Step-to pattern;Decreased stride length;Antalgic;Trunk flexed;Decreased weight shift to left Gait velocity: decreased Stairs: No Wheelchair Mobility Wheelchair Mobility: No    Exercises     PT Diagnosis: Difficulty walking;Abnormality of gait;Acute pain  PT Problem List: Decreased strength;Decreased range of motion;Decreased activity tolerance;Decreased balance;Decreased mobility;Decreased coordination;Decreased knowledge of use of DME;Decreased safety awareness;Decreased knowledge of precautions;Pain PT Treatment Interventions: DME instruction;Gait training;Stair training;Functional mobility training;Therapeutic activities;Therapeutic exercise;Balance training;Patient/family education   PT Goals Acute Rehab PT Goals PT Goal Formulation: With patient Time For Goal Achievement: 07/29/12 Potential to Achieve Goals: Good Pt will go Supine/Side to Sit: with supervision PT Goal: Supine/Side to Sit - Progress: Goal set today Pt will go Sit to Supine/Side: with supervision PT Goal: Sit to Supine/Side - Progress: Goal set today Pt will go Sit to Stand: with supervision PT Goal: Sit to Stand - Progress: Goal set today Pt will go Stand to Sit: with supervision PT Goal: Stand to Sit - Progress: Goal set today Pt will Ambulate: 51 - 150 feet;with supervision;with least restrictive assistive device PT Goal: Ambulate - Progress: Goal set today Pt will Go Up / Down Stairs: 1-2 stairs;with min assist;with least restrictive assistive device PT Goal: Up/Down Stairs - Progress: Goal set today  Visit Information  Last PT Received On: 07/26/12 Assistance  Needed: +1    Subjective Data  Subjective:  I'm just feel nausea Patient Stated Goal: to return home.    Prior Functioning  Home Living Lives With: Spouse Available Help at Discharge: Family;Available 24 hours/day Type of Home: House Home Access: Stairs to enter [employer] of Steps: 2 Entrance Stairs-Rails: None Home Layout: Two level;Able to live on main level with bedroom/bathroom Bathroom Shower/Tub:  (states she will use "powder room" under able to bath) Bathroom Toilet: Standard Home Adaptive Equipment: None Prior Function Level of Independence: Independent Driving: No Vocation: Retired Musician: No difficulties    Copywriter, advertising Arousal/Alertness: Awake/alert Behavior During Therapy: WFL for tasks assessed/performed Overall Cognitive Status: Within Functional Limits for tasks assessed    Extremity/Trunk Assessment Right Lower Extremity Assessment RLE ROM/Strength/Tone: WFL for tasks assessed RLE Sensation: WFL - Light Touch Left Lower Extremity Assessment LLE ROM/Strength/Tone: Deficits LLE ROM/Strength/Tone Deficits: ankle motions WFL, able to iniate SLR, however unable to sustain without assist.  LLE Sensation: WFL - Light Touch Trunk Assessment Trunk Assessment: Normal   Balance    End of Session PT - End of Session Equipment Utilized During Treatment: Gait belt;Left knee immobilizer Activity Tolerance: Patient tolerated treatment well;Other (comment) (limited by dizziness/nausea) Patient left: in chair;with call bell/phone within reach;with family/visitor present Nurse Communication: Mobility status  GP     Vista Deck 07/26/2012, 12:25 PM

## 2012-07-26 NOTE — Progress Notes (Signed)
   Subjective: 1 Day Post-Op Procedure(s) (LRB): TOTAL KNEE ARTHROPLASTY (Left) Patient reports pain as mild and moderate last night but better this morning. Patient seen in rounds with Dr. Lequita Halt. Patient is well, but has had some minor complaints of pain in the knee, requiring pain medications We will start therapy today.  Plan is to go Home after hospital stay.  Objective: Vital signs in last 24 hours: Temp:  [97.3 F (36.3 C)-99.2 F (37.3 C)] 98.6 F (37 C) (04/29 0543) Pulse Rate:  [48-56] 54 (04/29 0543) Resp:  [12-19] 16 (04/29 0543) BP: (103-126)/(60-73) 106/67 mmHg (04/29 0543) SpO2:  [97 %-100 %] 99 % (04/29 0543) Weight:  [76.658 kg (169 lb)] 76.658 kg (169 lb) (04/28 1430)  Intake/Output from previous day:  Intake/Output Summary (Last 24 hours) at 07/26/12 0823 Last data filed at 07/26/12 0800  Gross per 24 hour  Intake 5336.25 ml  Output   3285 ml  Net 2051.25 ml    Intake/Output this shift: Total I/O In: 360 [P.O.:360] Out: -   Labs:  Recent Labs  07/26/12 0420  HGB 10.4*    Recent Labs  07/26/12 0420  WBC 7.4  RBC 3.56*  HCT 31.4*  PLT 172    Recent Labs  07/26/12 0420  NA 135  K 3.8  CL 102  CO2 27  BUN 8  CREATININE 0.48*  GLUCOSE 138*  CALCIUM 8.3*   No results found for this basename: LABPT, INR,  in the last 72 hours  EXAM General - Patient is Alert, Appropriate and Oriented Extremity - Neurovascular intact Sensation intact distally Dorsiflexion/Plantar flexion intact Dressing - dressing C/D/I Motor Function - intact, moving foot and toes well on exam.  Hemovac pulled without difficulty.  Past Medical History  Diagnosis Date  . Gastritis     past hx of gastritis -no problem since  . GERD (gastroesophageal reflux disease)   . Arthritis     pain and oa left knee    Assessment/Plan: 1 Day Post-Op Procedure(s) (LRB): TOTAL KNEE ARTHROPLASTY (Left) Principal Problem:   OA (osteoarthritis) of knee  Estimated  body mass index is 28.99 kg/(m^2) as calculated from the following:   Height as of this encounter: 5\' 4"  (1.626 m).   Weight as of this encounter: 76.658 kg (169 lb). Advance diet Up with therapy Discharge home with home health  DVT Prophylaxis - Xarelto Weight-Bearing as tolerated to left leg No vaccines. D/C O2 and Pulse OX and try on Room [address]  Patrica Duel 07/26/2012, 8:23 AM

## 2012-07-26 NOTE — Progress Notes (Signed)
Physical Therapy Treatment Patient Details Name: Natalie Mcfarland MRN: [id number] DOB: 08-Jun-1940 Today's Date: 07/26/2012 Time: 5638-7564 PT Time Calculation (min): 34 min  PT Assessment / Plan / Recommendation Comments on Treatment Session  Pt progressing with ambulation and exercises with less c/o dizziness/nausea.     Follow Up Recommendations  Home health PT     Does the patient have the potential to tolerate intense rehabilitation     Barriers to Discharge        Equipment Recommendations  Rolling walker with 5" wheels;Other (comment)    Recommendations for Other Services OT consult  Frequency 7X/week   Plan Discharge plan remains appropriate    Precautions / Restrictions Precautions Precautions: Fall;Knee Required Braces or Orthoses: Knee Immobilizer - Left Knee Immobilizer - Left: Discontinue once straight leg raise with < 10 degree lag Restrictions Weight Bearing Restrictions: No Other Position/Activity Restrictions: WBAT   Pertinent Vitals/Pain 4/10, ice packs applied    Mobility  Bed Mobility Bed Mobility: Sit to Supine Sit to Supine: 4: Min assist Details for Bed Mobility Assistance: Assist for LLE into bed with min cues for adjusting hips in bed.  Transfers Transfers: Sit to Stand;Stand to Sit Sit to Stand: 4: Min assist;With upper extremity assist;From bed Stand to Sit: 4: Min assist;With upper extremity assist;To bed Details for Transfer Assistance: Continue to provide cues for hand placement and LE management.   Ambulation/Gait Ambulation/Gait Assistance: 4: Min assist Ambulation Distance (Feet): 50 Feet Assistive device: Rolling walker Ambulation/Gait Assistance Details: Mod cues for sequencing/technique with  RW, however it was improved from this morning.  Gait Pattern: Step-to pattern;Decreased stride length;Antalgic;Trunk flexed;Decreased weight shift to left Gait velocity: decreased    Exercises Total Joint Exercises Ankle Circles/Pumps:  AROM;Both;20 reps Quad Sets: AROM;Left;10 reps Heel Slides: AAROM;Left;10 reps Hip ABduction/ADduction: AAROM;Left;10 reps Straight Leg Raises: AAROM;Left;10 reps   PT Diagnosis:    PT Problem List:   PT Treatment Interventions:     PT Goals Acute Rehab PT Goals PT Goal Formulation: With patient Time For Goal Achievement: 07/29/12 Potential to Achieve Goals: Good Pt will go Supine/Side to Sit: with supervision PT Goal: Supine/Side to Sit - Progress: Progressing toward goal Pt will go Sit to Supine/Side: with supervision PT Goal: Sit to Supine/Side - Progress: Progressing toward goal Pt will go Sit to Stand: with supervision PT Goal: Sit to Stand - Progress: Progressing toward goal Pt will go Stand to Sit: with supervision PT Goal: Stand to Sit - Progress: Progressing toward goal Pt will Ambulate: 51 - 150 feet;with supervision;with least restrictive assistive device PT Goal: Ambulate - Progress: Progressing toward goal  Visit Information  Last PT Received On: 07/26/12 Assistance Needed: +1    Subjective Data  Subjective: I'm feeling a lot better.  Patient Stated Goal: to return home.    Cognition  Cognition Arousal/Alertness: Awake/alert Behavior During Therapy: WFL for tasks assessed/performed Overall Cognitive Status: Within Functional Limits for tasks assessed    Balance     End of Session PT - End of Session Equipment Utilized During Treatment: Left knee immobilizer Activity Tolerance: Patient tolerated treatment well Patient left: in bed;with call bell/phone within reach Nurse Communication: Mobility status   GP     Vista Deck 07/26/2012, 5:43 PM

## 2012-07-27 LAB — CBC
HCT: 31 % — ABNORMAL LOW (ref 36.0–46.0)
Hemoglobin: 10.1 g/dL — ABNORMAL LOW (ref 12.0–15.0)
MCHC: 32.6 g/dL (ref 30.0–36.0)
RBC: 3.53 MIL/uL — ABNORMAL LOW (ref 3.87–5.11)
WBC: 9.3 10*3/uL (ref 4.0–10.5)

## 2012-07-27 LAB — URINE CULTURE: Colony Count: 70000

## 2012-07-27 LAB — BASIC METABOLIC PANEL
Chloride: 101 mEq/L (ref 96–112)
GFR calc non Af Amer: >90 mL/min (ref >90)
Glucose, Bld: 134 mg/dL — ABNORMAL HIGH (ref 70–99)
Potassium: 3.4 mEq/L — ABNORMAL LOW (ref 3.5–5.1)
Sodium: 136 mEq/L (ref 135–145)

## 2012-07-27 MED ORDER — TRAMADOL HCL 50 MG PO TABS: 50.0000 mg | ORAL_TABLET | Freq: Four times a day (QID) | ORAL | Status: DC | PRN

## 2012-07-27 MED ORDER — OXYCODONE HCL 5 MG PO TABS: 5.0000 mg | ORAL_TABLET | ORAL | Status: DC | PRN

## 2012-07-27 MED ORDER — RIVAROXABAN 10 MG PO TABS: 10.0000 mg | ORAL_TABLET | Freq: Every day | ORAL | Status: DC

## 2012-07-27 MED ORDER — METHOCARBAMOL 500 MG PO TABS: 500.0000 mg | ORAL_TABLET | Freq: Four times a day (QID) | ORAL | Status: DC | PRN

## 2012-07-27 NOTE — Progress Notes (Signed)
   Subjective: 2 Days Post-Op Procedure(s) (LRB): TOTAL KNEE ARTHROPLASTY (Left) Patient reports pain as mild and moderate.   Patient seen in rounds for Dr. Lequita Halt. Patient is well, but has had some minor complaints of pain in the knee, requiring pain medications.  She has also had some intermittent nausea.  If continues, consider changing pain meds. Plan is to go Home after hospital stay.  Objective: Vital signs in last 24 hours: Temp:  [98.3 F (36.8 C)-100 F (37.8 C)] 99.5 F (37.5 C) (04/30 2109) Pulse Rate:  [68-76] 68 (04/30 2109) Resp:  [16-18] 16 (04/30 2109) BP: (127-144)/(77-84) 127/82 mmHg (04/30 2109) SpO2:  [92 %-96 %] 92 % (04/30 2109)  Intake/Output from previous day:  Intake/Output Summary (Last 24 hours) at 07/27/12 2212 Last data filed at 07/27/12 1355  Gross per 24 hour  Intake    480 ml  Output   2150 ml  Net  -1670 ml    Intake/Output this shift:    Labs:  Recent Labs  07/26/12 0420 07/27/12 0420  HGB 10.4* 10.1*    Recent Labs  07/26/12 0420 07/27/12 0420  WBC 7.4 9.3  RBC 3.56* 3.53*  HCT 31.4* 31.0*  PLT 172 166    Recent Labs  07/26/12 0420 07/27/12 0420  NA 135 136  K 3.8 3.4*  CL 102 101  CO2 27 29  BUN 8 6  CREATININE 0.48* 0.48*  GLUCOSE 138* 134*  CALCIUM 8.3* 8.7   No results found for this basename: LABPT, INR,  in the last 72 hours  EXAM General - Patient is Alert, Appropriate and Oriented Extremity - Neurovascular intact Sensation intact distally Dorsiflexion/Plantar flexion intact No cellulitis present Dressing/Incision - clean, dry, no drainage, healing Motor Function - intact, moving foot and toes well on exam.   Past Medical History  Diagnosis Date  . Gastritis     past hx of gastritis -no problem since  . GERD (gastroesophageal reflux disease)   . Arthritis     pain and oa left knee    Assessment/Plan: 2 Days Post-Op Procedure(s) (LRB): TOTAL KNEE ARTHROPLASTY (Left) Principal Problem:  OA (osteoarthritis) of knee  Estimated body mass index is 28.99 kg/(m^2) as calculated from the following:   Height as of this encounter: 5\' 4"  (1.626 m).   Weight as of this encounter: 76.658 kg (169 lb). Up with therapy Plan for discharge tomorrow Discharge home with home health  DVT Prophylaxis - Xarelto Weight-Bearing as tolerated to left leg  Willene Holian 07/27/2012, 10:12 PM

## 2012-07-27 NOTE — Progress Notes (Signed)
Physical Therapy Treatment Patient Details Name: Natalie Mcfarland MRN: [id number] DOB: 06-Mar-1941 Today's Date: 07/27/2012 Time: 4098-1191 PT Time Calculation (min): 23 min  PT Assessment / Plan / Recommendation Comments on Treatment Session  Pt progressing with ambulation, however requires mod encouragement to continue with ambulation.      Follow Up Recommendations  Home health PT     Does the patient have the potential to tolerate intense rehabilitation     Barriers to Discharge        Equipment Recommendations  Rolling walker with 5" wheels;Other (comment)    Recommendations for Other Services OT consult  Frequency 7X/week   Plan Discharge plan remains appropriate    Precautions / Restrictions Precautions Precautions: Fall;Knee Required Braces or Orthoses: Knee Immobilizer - Left Knee Immobilizer - Left: Discontinue once straight leg raise with < 10 degree lag Restrictions Weight Bearing Restrictions: No Other Position/Activity Restrictions: WBAT   Pertinent Vitals/Pain 5/10 pain, ice packs applied    Mobility  Transfers Transfers: Sit to Stand;Stand to Sit Sit to Stand: 4: Min assist;With upper extremity assist;From chair/3-in-1 Stand to Sit: 4: Min assist;With upper extremity assist;To chair/3-in-1 Details for Transfer Assistance: min vebal cues for hand placement and L LE management Ambulation/Gait Ambulation/Gait Assistance: 4: Min assist Ambulation Distance (Feet): 70 Feet Assistive device: Rolling walker Ambulation/Gait Assistance Details: Continues to require cues for upright posture and correct sequencing/technique as she tends to speed up and get out of sequence.  Gait Pattern: Step-to pattern;Decreased stride length;Antalgic;Trunk flexed;Decreased weight shift to left Gait velocity: decreased    Exercises     PT Diagnosis:    PT Problem List:   PT Treatment Interventions:     PT Goals Acute Rehab PT Goals PT Goal Formulation: With patient Time For  Goal Achievement: 07/29/12 Potential to Achieve Goals: Good Pt will go Sit to Stand: with supervision PT Goal: Sit to Stand - Progress: Progressing toward goal Pt will go Stand to Sit: with supervision PT Goal: Stand to Sit - Progress: Progressing toward goal Pt will Ambulate: 51 - 150 feet;with supervision;with least restrictive assistive device PT Goal: Ambulate - Progress: Progressing toward goal  Visit Information  Last PT Received On: 07/27/12 Assistance Needed: +1    Subjective Data  Subjective: My main focus is the physical therapy.  Patient Stated Goal: to return home.    Cognition  Cognition Arousal/Alertness: Awake/alert Behavior During Therapy: WFL for tasks assessed/performed Overall Cognitive Status: Within Functional Limits for tasks assessed    Balance  Balance Balance Assessed: Yes Dynamic Standing Balance Dynamic Standing - Level of Assistance: 4: Min assist  End of Session PT - End of Session Equipment Utilized During Treatment: Left knee immobilizer Activity Tolerance: Patient tolerated treatment well Patient left: in chair;with call bell/phone within reach Nurse Communication: Mobility status CPM Left Knee CPM Left Knee: Off   GP     Vista Deck 07/27/2012, 11:30 AM

## 2012-07-27 NOTE — Progress Notes (Signed)
Physical Therapy Treatment Patient Details Name: Natalie Mcfarland MRN: [id number] DOB: 02-02-1941 Today's Date: 07/27/2012 Time: 4098-1191 PT Time Calculation (min): 36 min  PT Assessment / Plan / Recommendation Comments on Treatment Session  Pt progressing with ambulation, however requires mod encouragement to continue with ambulation.      Follow Up Recommendations  Home health PT     Does the patient have the potential to tolerate intense rehabilitation     Barriers to Discharge        Equipment Recommendations  Rolling walker with 5" wheels;Other (comment)    Recommendations for Other Services OT consult  Frequency 7X/week   Plan Discharge plan remains appropriate    Precautions / Restrictions Precautions Precautions: Fall;Knee Required Braces or Orthoses: Knee Immobilizer - Left Knee Immobilizer - Left: Discontinue once straight leg raise with < 10 degree lag Restrictions Weight Bearing Restrictions: No Other Position/Activity Restrictions: WBAT   Pertinent Vitals/Pain 3/10 pain, ice packs applied    Mobility  Bed Mobility Bed Mobility: Supine to Sit;Sit to Supine Supine to Sit: 4: Min guard;HOB elevated Sit to Supine: 4: Min assist Details for Bed Mobility Assistance: Guarding assist for LLE out of bed, with increased assist for LE into bed.  Cues for hand placement on bed instead of rails to better simulate home environment.  Transfers Transfers: Sit to Stand;Stand to Sit Sit to Stand: 4: Min assist;With upper extremity assist;From toilet Stand to Sit: 4: Min assist;With upper extremity assist;With armrests;To chair/3-in-1 Details for Transfer Assistance: Min cues for hand placement and LE management.  Improved technique from am session,.  Ambulation/Gait Ambulation/Gait Assistance: 4: Min guard;5: Supervision Ambulation Distance (Feet): 200 Feet Assistive device: Rolling walker Ambulation/Gait Assistance Details: Pt with marked improvement in technique this  afternoon.  Min cues intially for technique/sequencing and pt was also able to transition to step through gait pattern.  Gait Pattern: Step-to pattern;Decreased stride length;Antalgic;Trunk flexed;Decreased weight shift to left Gait velocity: decreased    Exercises Total Joint Exercises Ankle Circles/Pumps: AROM;Both;20 reps Quad Sets: AROM;Left;10 reps Heel Slides: AAROM;Left;10 reps Hip ABduction/ADduction: AAROM;Left;10 reps Straight Leg Raises: AAROM;Left;10 reps   PT Diagnosis:    PT Problem List:   PT Treatment Interventions:     PT Goals Acute Rehab PT Goals PT Goal Formulation: With patient Time For Goal Achievement: 07/29/12 Potential to Achieve Goals: Good Pt will go Supine/Side to Sit: with supervision PT Goal: Supine/Side to Sit - Progress: Progressing toward goal Pt will go Sit to Supine/Side: with supervision PT Goal: Sit to Supine/Side - Progress: Progressing toward goal Pt will go Sit to Stand: with supervision PT Goal: Sit to Stand - Progress: Progressing toward goal Pt will go Stand to Sit: with supervision PT Goal: Stand to Sit - Progress: Progressing toward goal Pt will Ambulate: 51 - 150 feet;with supervision;with least restrictive assistive device PT Goal: Ambulate - Progress: Progressing toward goal  Visit Information  Last PT Received On: 07/27/12 Assistance Needed: +1    Subjective Data  Subjective: I'm ready to walk.  Patient Stated Goal: to return home.    Cognition  Cognition Arousal/Alertness: Awake/alert Behavior During Therapy: WFL for tasks assessed/performed Overall Cognitive Status: Within Functional Limits for tasks assessed    Balance     End of Session PT - End of Session Equipment Utilized During Treatment: Left knee immobilizer Activity Tolerance: Patient tolerated treatment well Patient left: in chair;with call bell/phone within reach Nurse Communication: Mobility status CPM Left Knee CPM Left Knee: On   GP  Vista Deck 07/27/2012, 5:10 PM

## 2012-07-27 NOTE — Discharge Summary (Signed)
Physician Discharge Summary   Patient ID: Natalie Mcfarland MRN: [id number] DOB/AGE: 12/18/1940 72 y.o.  Admit date: 07/25/2012 Discharge date: 07/28/2012  Primary Diagnosis:  Osteoarthritis Left knee  Admission Diagnoses:  Past Medical History  Diagnosis Date  . Gastritis     past hx of gastritis -no problem since  . GERD (gastroesophageal reflux disease)   . Arthritis     pain and oa left knee   Discharge Diagnoses:   Principal Problem:   OA (osteoarthritis) of knee Active Problems:   Postop Hypokalemia  Estimated body mass index is 28.99 kg/(m^2) as calculated from the following:   Height as of this encounter: 5\' 4"  (1.626 m).   Weight as of this encounter: 76.658 kg (169 lb).  Procedure:  Procedure(s) (LRB): TOTAL KNEE ARTHROPLASTY (Left)   Consults: None  HPI: Natalie Mcfarland is a 72 y.o. year old female with end stage OA of her left knee with progressively worsening pain and dysfunction. She has constant pain, with activity and at rest and significant functional deficits with difficulties even with ADLs. She has had extensive non-op management including analgesics, injections of cortisone, and home exercise program, but remains in significant pain with significant dysfunction. Radiographs show bone on bone arthritis medial and patellofemoral. She presents now for left Total Knee Arthroplasty.   Laboratory Data: Admission on 07/25/2012  Component Date Value Range Status  . Color, Urine 07/25/2012 AMBER* YELLOW Final   BIOCHEMICALS MAY BE AFFECTED BY COLOR  . APPearance 07/25/2012 CLOUDY* CLEAR Final  . Specific Gravity, Urine 07/25/2012 1.028  1.005 - 1.030 Final  . pH 07/25/2012 5.5  5.0 - 8.0 Final  . Glucose, UA 07/25/2012 NEGATIVE  NEGATIVE mg/dL Final  . Hgb urine dipstick 07/25/2012 SMALL* NEGATIVE Final  . Bilirubin Urine 07/25/2012 NEGATIVE  NEGATIVE Final  . Ketones, ur 07/25/2012 NEGATIVE  NEGATIVE mg/dL Final  . Protein, ur 40/98/1191 NEGATIVE  NEGATIVE mg/dL  Final  . Urobilinogen, UA 07/25/2012 0.2  0.0 - 1.0 mg/dL Final  . Nitrite 47/82/9562 NEGATIVE  NEGATIVE Final  . Leukocytes, UA 07/25/2012 MODERATE* NEGATIVE Final  . Squamous Epithelial / LPF 07/25/2012 MANY* RARE Final  . WBC, UA 07/25/2012 7-10  <3 WBC/hpf Final  . RBC / HPF 07/25/2012 3-6  <3 RBC/hpf Final  . Bacteria, UA 07/25/2012 FEW* RARE Final  . Urine-Other 07/25/2012 MUCOUS PRESENT   Final  . Specimen Description 07/25/2012 URINE, CLEAN CATCH   Final  . Special Requests 07/25/2012 NONE   Final  . Culture  Setup Time 07/25/2012 07/25/2012 10:25   Final  . Colony Count 07/25/2012 70,000 COLONIES/ML   Final  . Culture 07/25/2012    Final                   Value:LACTOBACILLUS SPECIES                         Note: Standardized susceptibility testing for this organism is not available.  . Report Status 07/25/2012 07/27/2012 FINAL   Final  . ABO/RH(D) 07/25/2012 A POS   Final  . Antibody Screen 07/25/2012 NEG   Final  . Sample Expiration 07/25/2012 07/28/2012   Final  . ABO/RH(D) 07/25/2012 A POS   Final  . WBC 07/26/2012 7.4  4.0 - 10.5 K/uL Final  . RBC 07/26/2012 3.56* 3.87 - 5.11 MIL/uL Final  . Hemoglobin 07/26/2012 10.4* 12.0 - 15.0 g/dL Final  . HCT 13/10/6576 31.4* 36.0 - 46.0 % Final  . MCV  07/26/2012 88.2  78.0 - 100.0 fL Final  . MCH 07/26/2012 29.2  26.0 - 34.0 pg Final  . MCHC 07/26/2012 33.1  30.0 - 36.0 g/dL Final  . RDW 16/12/9602 13.5  11.5 - 15.5 % Final  . Platelets 07/26/2012 172  150 - 400 K/uL Final  . Sodium 07/26/2012 135  135 - 145 mEq/L Final  . Potassium 07/26/2012 3.8  3.5 - 5.1 mEq/L Final  . Chloride 07/26/2012 102  96 - 112 mEq/L Final  . CO2 07/26/2012 27  19 - 32 mEq/L Final  . Glucose, Bld 07/26/2012 138* 70 - 99 mg/dL Final  . BUN 54/11/8117 8  6 - 23 mg/dL Final  . Creatinine, Ser 07/26/2012 0.48* 0.50 - 1.10 mg/dL Final  . Calcium 14/78/2956 8.3* 8.4 - 10.5 mg/dL Final  . GFR calc non Af Amer 07/26/2012 >90  >90 mL/min Final  . GFR calc  Af Amer 07/26/2012 >90  >90 mL/min Final   Comment:                                 The eGFR has been calculated                          using the CKD EPI equation.                          This calculation has not been                          validated in all clinical                          situations.                          eGFR's persistently                          <90 mL/min signify                          possible Chronic Kidney Disease.  . WBC 07/27/2012 9.3  4.0 - 10.5 K/uL Final  . RBC 07/27/2012 3.53* 3.87 - 5.11 MIL/uL Final  . Hemoglobin 07/27/2012 10.1* 12.0 - 15.0 g/dL Final  . HCT 21/30/8657 31.0* 36.0 - 46.0 % Final  . MCV 07/27/2012 87.8  78.0 - 100.0 fL Final  . MCH 07/27/2012 28.6  26.0 - 34.0 pg Final  . MCHC 07/27/2012 32.6  30.0 - 36.0 g/dL Final  . RDW 84/69/6295 13.6  11.5 - 15.5 % Final  . Platelets 07/27/2012 166  150 - 400 K/uL Final  . Sodium 07/27/2012 136  135 - 145 mEq/L Final  . Potassium 07/27/2012 3.4* 3.5 - 5.1 mEq/L Final  . Chloride 07/27/2012 101  96 - 112 mEq/L Final  . CO2 07/27/2012 29  19 - 32 mEq/L Final  . Glucose, Bld 07/27/2012 134* 70 - 99 mg/dL Final  . BUN 28/41/3244 6  6 - 23 mg/dL Final  . Creatinine, Ser 07/27/2012 0.48* 0.50 - 1.10 mg/dL Final  . Calcium 04/01/7251 8.7  8.4 - 10.5 mg/dL Final  . GFR calc non Af Amer 07/27/2012 >90  >90 mL/min Final  .  GFR calc Af Amer 07/27/2012 >90  >90 mL/min Final   Comment:                                 The eGFR has been calculated                          using the CKD EPI equation.                          This calculation has not been                          validated in all clinical                          situations.                          eGFR's persistently                          <90 mL/min signify                          possible Chronic Kidney Disease.  . WBC 07/28/2012 7.9  4.0 - 10.5 K/uL Final  . RBC 07/28/2012 3.45* 3.87 - 5.11 MIL/uL Final  . Hemoglobin  07/28/2012 10.4* 12.0 - 15.0 g/dL Final  . HCT 16/12/9602 30.5* 36.0 - 46.0 % Final  . MCV 07/28/2012 88.4  78.0 - 100.0 fL Final  . MCH 07/28/2012 30.1  26.0 - 34.0 pg Final  . MCHC 07/28/2012 34.1  30.0 - 36.0 g/dL Final  . RDW 54/11/8117 13.8  11.5 - 15.5 % Final  . Platelets 07/28/2012 168  150 - 400 K/uL Final  Hospital Outpatient Visit on 07/08/2012  Component Date Value Range Status  . MRSA, PCR 07/08/2012 NEGATIVE  NEGATIVE Final  . Staphylococcus aureus 07/08/2012 NEGATIVE  NEGATIVE Final   Comment:                                 The Xpert SA Assay (FDA                          approved for NASAL specimens                          in patients over 33 years of age),                          is one component of                          a comprehensive surveillance                          program.  Test performance has                          been validated by [employer]  [facility] for patients greater                          than or equal to 37 year old.                          It is not intended                          to diagnose infection nor to                          guide or monitor treatment.  . WBC 07/08/2012 5.0  4.0 - 10.5 K/uL Final  . RBC 07/08/2012 4.20  3.87 - 5.11 MIL/uL Final  . Hemoglobin 07/08/2012 12.4  12.0 - 15.0 g/dL Final  . HCT 16/12/9602 37.4  36.0 - 46.0 % Final  . MCV 07/08/2012 89.0  78.0 - 100.0 fL Final  . MCH 07/08/2012 29.5  26.0 - 34.0 pg Final  . MCHC 07/08/2012 33.2  30.0 - 36.0 g/dL Final  . RDW 54/11/8117 13.4  11.5 - 15.5 % Final  . Platelets 07/08/2012 233  150 - 400 K/uL Final  . Sodium 07/08/2012 140  135 - 145 mEq/L Final  . Potassium 07/08/2012 4.2  3.5 - 5.1 mEq/L Final  . Chloride 07/08/2012 104  96 - 112 mEq/L Final  . CO2 07/08/2012 29  19 - 32 mEq/L Final  . Glucose, Bld 07/08/2012 96  70 - 99 mg/dL Final  . BUN 14/78/2956 12  6 - 23 mg/dL Final  . Creatinine, Ser 07/08/2012 0.51  0.50 - 1.10 mg/dL  Final  . Calcium 21/30/8657 9.2  8.4 - 10.5 mg/dL Final  . Total Protein 07/08/2012 7.2  6.0 - 8.3 g/dL Final  . Albumin 84/69/6295 3.6  3.5 - 5.2 g/dL Final  . AST 28/41/3244 30  0 - 37 U/L Final  . ALT 07/08/2012 26  0 - 35 U/L Final  . Alkaline Phosphatase 07/08/2012 64  39 - 117 U/L Final  . Total Bilirubin 07/08/2012 0.4  0.3 - 1.2 mg/dL Final  . GFR calc non Af Amer 07/08/2012 >90  >90 mL/min Final  . GFR calc Af Amer 07/08/2012 >90  >90 mL/min Final   Comment:                                 The eGFR has been calculated                          using the CKD EPI equation.                          This calculation has not been                          validated in all clinical                          situations.                          eGFR's persistently                          <  90 mL/min signify                          possible Chronic Kidney Disease.  Marland Kitchen Prothrombin Time 07/08/2012 12.7  11.6 - 15.2 seconds Final  . INR 07/08/2012 0.96  0.00 - 1.49 Final  . aPTT 07/08/2012 27  24 - 37 seconds Final     X-Rays:Dg Chest 2 View  07/08/2012  *RADIOLOGY REPORT*  Clinical Data: Preoperative respiratory films.  CHEST - 2 VIEW  Comparison: None.  Findings: Heart size is upper normal.  Lungs are clear.  No pneumothorax or pleural effusion.  IMPRESSION: No acute disease.   Original Report Authenticated By: Holley Dexter, M.D.     EKG: Orders placed during the hospital encounter of 07/08/12  . EKG 12-LEAD  . EKG 12-LEAD     Hospital Course: Soren Pigman is a 72 y.o. who was admitted to [facility]. They were brought to the operating room on 07/25/2012 and underwent Procedure(s): TOTAL KNEE ARTHROPLASTY.  Patient tolerated the procedure well and was later transferred to the recovery room and then to the orthopaedic floor for postoperative care.  They were given PO and IV analgesics for pain control following their surgery.  They were given 24 hours of postoperative  antibiotics of  Anti-infectives   Start     Dose/Rate Route Frequency Ordered Stop   07/25/12 1630  ceFAZolin (ANCEF) IVPB 1 g/50 mL premix     1 g 100 mL/hr over 30 Minutes Intravenous Every 6 hours 07/25/12 1438 07/25/12 2349   07/25/12 0830  ceFAZolin (ANCEF) IVPB 2 g/50 mL premix     2 g 100 mL/hr over 30 Minutes Intravenous On call to O.R. 07/25/12 1610 07/25/12 1034     and started on DVT prophylaxis in the form of Xarelto.   PT and OT were ordered for total joint protocol.  Discharge planning consulted to help with postop disposition and equipment needs.  Patient had a tough night on the evening of surgery.  They started to get up OOB with therapy on day one walking about 50 feet that afternoon. Hemovac drain was pulled without difficulty.  Continued to work with therapy into day two walking about 70 and then later about 200 feet.  Dressing was changed on day two and the incision was healing well.  By day three, the patient had progressed with therapy and meeting their goals.  Incision was healing well.  Patient was seen in rounds and was ready to go home.   Discharge Medications: Prior to Admission medications   Medication Sig Start Date End Date Taking? Authorizing Provider  loratadine (CLARITIN) 10 MG tablet Take 10 mg by mouth daily.   Yes Historical Provider, MD  methocarbamol (ROBAXIN) 500 MG tablet Take 1 tablet (500 mg total) by mouth every 6 (six) hours as needed. 07/27/12   Vianne Grieshop, PA-C  oxyCODONE (OXY IR/ROXICODONE) 5 MG immediate release tablet Take 1-2 tablets (5-10 mg total) by mouth every 3 (three) hours as needed. 07/27/12   Niam Nepomuceno Julien Girt, PA-C  rivaroxaban (XARELTO) 10 MG TABS tablet Take 1 tablet (10 mg total) by mouth daily with breakfast. Take Xarelto for two and a half more weeks, then discontinue Xarelto. 07/27/12   Rihanna Marseille, PA-C  traMADol (ULTRAM) 50 MG tablet Take 1-2 tablets (50-100 mg total) by mouth every 6 (six) hours as needed.  07/27/12   Raizy Auzenne Julien Girt, PA-C    Diet: Regular diet Activity:WBAT Follow-up:in  2 weeks, Tuesday May 13th Disposition - Home Discharged Condition: good       Discharge Orders   Future Orders Complete By Expires     Call MD / Call 911  As directed     Comments:      If you experience chest pain or shortness of breath, CALL 911 and be transported to the hospital emergency room.  If you develope a fever above 101 F, pus (white drainage) or increased drainage or redness at the wound, or calf pain, call your surgeon's office.    Change dressing  As directed     Comments:      Change dressing daily with sterile 4 x 4 inch gauze dressing and apply TED hose. Do not submerge the incision under water.    Constipation Prevention  As directed     Comments:      Drink plenty of fluids.  Prune juice may be helpful.  You may use a stool softener, such as Colace (over the counter) 100 mg twice a day.  Use MiraLax (over the counter) for constipation as needed.    Diet general  As directed     Discharge instructions  As directed     Comments:      Pick up stool softner and laxative for home. Do not submerge incision under water. May shower. Continue to use ice for pain and swelling from surgery. Hip precautions.  Total Hip Protocol.  Take Xarelto for two and a half more weeks, then discontinue Xarelto.    Do not put a pillow under the knee. Place it under the heel.  As directed     Do not sit on low chairs, stoools or toilet seats, as it may be difficult to get up from low surfaces  As directed     Driving restrictions  As directed     Comments:      No driving until released by the physician.    Increase activity slowly as tolerated  As directed     Lifting restrictions  As directed     Comments:      No lifting until released by the physician.    Patient may shower  As directed     Comments:      You may shower without a dressing once there is no drainage.  Do not wash over the  wound.  If drainage remains, do not shower until drainage stops.    TED hose  As directed     Comments:      Use stockings (TED hose) for 3 weeks on both leg(s).  You may remove them at night for sleeping.    Weight bearing as tolerated  As directed         Medication List    STOP taking these medications       fish oil-omega-3 fatty acids 1000 MG capsule     ibuprofen 200 MG tablet  Commonly known as:  ADVIL,MOTRIN      TAKE these medications       loratadine 10 MG tablet  Commonly known as:  CLARITIN  Take 10 mg by mouth daily.     methocarbamol 500 MG tablet  Commonly known as:  ROBAXIN  Take 1 tablet (500 mg total) by mouth every 6 (six) hours as needed.     oxyCODONE 5 MG immediate release tablet  Commonly known as:  Oxy IR/ROXICODONE  Take 1-2 tablets (5-10 mg total) by mouth every 3 (three)  hours as needed.     rivaroxaban 10 MG Tabs tablet  Commonly known as:  XARELTO  Take 1 tablet (10 mg total) by mouth daily with breakfast. Take Xarelto for two and a half more weeks, then discontinue Xarelto.     traMADol 50 MG tablet  Commonly known as:  ULTRAM  Take 1-2 tablets (50-100 mg total) by mouth every 6 (six) hours as needed.       Follow-up Information   Follow up with Loanne Drilling, MD. Schedule an appointment as soon as possible for a visit on 08/09/2012. (Call [phone] tomorrow to make the appointment)    Contact information:   [address] [place] [address] [phone]       Signed: Patrica Duel 07/28/2012, 7:22 AM

## 2012-07-27 NOTE — Evaluation (Signed)
Occupational Therapy Evaluation Patient Details Name: Natalie Mcfarland MRN: [id number] DOB: 1940/11/02 Today's Date: 07/27/2012 Time: 5638-7564 OT Time Calculation (min): 23 min  OT Assessment / Plan / Recommendation Clinical Impression  Pt is a L TKA and displays decreased strength and overall decreased independence with ADL. Will benefit from skilled OT services to improve ADL independence.     OT Assessment  Patient needs continued OT Services    Follow Up Recommendations  Home health OT;Supervision/Assistance - 24 hour    Barriers to Discharge      Equipment Recommendations  3 in 1 bedside comode;Tub/shower bench    Recommendations for Other Services    Frequency  Min 2X/week    Precautions / Restrictions Precautions Precautions: Fall;Knee Required Braces or Orthoses: Knee Immobilizer - Left Knee Immobilizer - Left: Discontinue once straight leg raise with < 10 degree lag Restrictions Weight Bearing Restrictions: No Other Position/Activity Restrictions: WBAT        ADL  Eating/Feeding: Simulated;Independent Where Assessed - Eating/Feeding: Chair Grooming: Performed;Wash/dry hands;Minimal assistance Where Assessed - Grooming: Unsupported standing Upper Body Bathing: Simulated;Chest;Right arm;Left arm;Abdomen;Set up;Supervision/safety Where Assessed - Upper Body Bathing: Unsupported sitting Lower Body Bathing: Simulated;Minimal assistance Where Assessed - Lower Body Bathing: Supported sit to stand Upper Body Dressing: Simulated;Set up;Supervision/safety Where Assessed - Upper Body Dressing: Unsupported sitting Lower Body Dressing: Simulated;Moderate assistance Where Assessed - Lower Body Dressing: Supported sit to Pharmacist, hospital: Performed;Minimal Dentist Method: Other (comment) (with walker into bathroom) Toilet Transfer Equipment: Raised toilet seat with arms (or 3-in-1 over toilet) Toileting - Clothing Manipulation and Hygiene:  Performed;Minimal assistance Where Assessed - Engineer, mining and Hygiene: Sit to stand from 3-in-1 or toilet Equipment Used: Rolling walker ADL Comments: pt and spouse are interested in a tub transfer bench. Pt needs frequent cues to keep RW on the floor and not pick it up.    OT Diagnosis: Generalized weakness  OT Problem List: Decreased strength;Decreased knowledge of use of DME or AE OT Treatment Interventions: Self-care/ADL training;DME and/or AE instruction;Therapeutic activities;Patient/family education   OT Goals Acute Rehab OT Goals OT Goal Formulation: With patient Time For Goal Achievement: 08/03/12 Potential to Achieve Goals: Good ADL Goals Pt Will Perform Grooming: with supervision;Standing at sink ADL Goal: Grooming - Progress: Goal set today Pt Will Transfer to Toilet: with supervision;Ambulation;3-in-1 ADL Goal: Toilet Transfer - Progress: Goal set today Pt Will Perform Toileting - Clothing Manipulation: with supervision;Standing ADL Goal: Toileting - Clothing Manipulation - Progress: Goal set today Pt Will Perform Tub/Shower Transfer: with min assist;Tub transfer;Transfer tub bench ADL Goal: Tub/Shower Transfer - Progress: Goal set today  Visit Information  Last OT Received On: 07/27/12 Assistance Needed: +1    Subjective Data  Subjective: i need to go to bathroom Patient Stated Goal: none stated. agreeable to get up with OT   Prior Functioning     Home Living Lives With: Spouse Available Help at Discharge: Family;Available 24 hours/day Type of Home: House Home Access: Stairs to enter [employer] of Steps: 2 Entrance Stairs-Rails: None Home Layout: Two level;Able to live on main level with bedroom/bathroom Bathroom Shower/Tub: Tub/shower unit (states she will use "powder room" under able to bath) Bathroom Toilet: Standard Home Adaptive Equipment: None Prior Function Level of Independence: Independent Driving: No Vocation:  Retired Musician: No difficulties         Vision/Perception     Copywriter, advertising Arousal/Alertness: Awake/alert Behavior During Therapy: WFL for tasks assessed/performed Overall Cognitive Status: Within Functional Limits for  tasks assessed    Extremity/Trunk Assessment Right Upper Extremity Assessment RUE ROM/Strength/Tone: [facility] for tasks assessed Left Upper Extremity Assessment LUE ROM/Strength/Tone: WFL for tasks assessed     Mobility Transfers Transfers: Sit to Stand;Stand to Sit Sit to Stand: 4: Min assist;With upper extremity assist;From chair/3-in-1 Stand to Sit: 4: Min assist;With upper extremity assist;To chair/3-in-1 Details for Transfer Assistance: min vebal cues for hand placement and L LE management     Exercise     Balance Balance Balance Assessed: Yes Dynamic Standing Balance Dynamic Standing - Level of Assistance: 4: Min assist   End of Session OT - End of Session Activity Tolerance: Patient tolerated treatment well Patient left: in chair;with call bell/phone within reach;with family/visitor present CPM Left Knee CPM Left Knee: Off  GO     Lennox Laity [phone] 07/27/2012, 11:28 AM

## 2012-07-27 NOTE — Care Management (Addendum)
Chart reviewed & updated. Pt to dc home with [facility]. PT recommends RW, DME delivery scheduled to room prior to discharge. Previous following Cm informed this CM that pt requesting tub bench. [facility] DME rep notified.  Roxy Manns Polly Barner,RN,BSN [phone]

## 2012-07-28 DIAGNOSIS — E876 Hypokalemia: Secondary | ICD-10-CM

## 2012-07-28 LAB — CBC
HCT: 30.5 % — ABNORMAL LOW (ref 36.0–46.0)
Hemoglobin: 10.4 g/dL — ABNORMAL LOW (ref 12.0–15.0)
RBC: 3.45 MIL/uL — ABNORMAL LOW (ref 3.87–5.11)
WBC: 7.9 10*3/uL (ref 4.0–10.5)

## 2012-07-28 MED ORDER — POTASSIUM CHLORIDE CRYS ER 20 MEQ PO TBCR
40.0000 meq | EXTENDED_RELEASE_TABLET | Freq: Once | ORAL | Status: AC
  Administered 2012-07-28: 40 meq via ORAL
  Filled 2012-07-28: qty 2

## 2012-07-28 NOTE — Progress Notes (Signed)
   Subjective: 3 Days Post-Op Procedure(s) (LRB): TOTAL KNEE ARTHROPLASTY (Left) Patient reports pain as mild.   Patient seen in rounds for Dr. Lequita Halt. Patient is well, but has had some minor complaints of pain in the knee, requiring pain medications Patient is ready to go home later today with family.  Objective: Vital signs in last 24 hours: Temp:  [98.3 F (36.8 C)-99.5 F (37.5 C)] 98.4 F (36.9 C) (05/01 0634) Pulse Rate:  [68-70] 69 (05/01 0634) Resp:  [16-18] 16 (05/01 0634) BP: (116-137)/(73-84) 116/73 mmHg (05/01 0634) SpO2:  [92 %-96 %] 96 % (05/01 0634)  Intake/Output from previous day:  Intake/Output Summary (Last 24 hours) at 07/28/12 0720 Last data filed at 07/27/12 1355  Gross per 24 hour  Intake    480 ml  Output    700 ml  Net   -220 ml    Intake/Output this shift:    Labs:  Recent Labs  07/26/12 0420 07/27/12 0420 07/28/12 0423  HGB 10.4* 10.1* 10.4*    Recent Labs  07/27/12 0420 07/28/12 0423  WBC 9.3 7.9  RBC 3.53* 3.45*  HCT 31.0* 30.5*  PLT 166 168    Recent Labs  07/26/12 0420 07/27/12 0420  NA 135 136  K 3.8 3.4*  CL 102 101  CO2 27 29  BUN 8 6  CREATININE 0.48* 0.48*  GLUCOSE 138* 134*  CALCIUM 8.3* 8.7   No results found for this basename: LABPT, INR,  in the last 72 hours  EXAM: General - Patient is Alert, Appropriate and Oriented Extremity - Neurovascular intact Sensation intact distally Dorsiflexion/Plantar flexion intact No cellulitis present Incision - clean, dry, no drainage, healing Motor Function - intact, moving foot and toes well on exam.   Assessment/Plan: 3 Days Post-Op Procedure(s) (LRB): TOTAL KNEE ARTHROPLASTY (Left) Procedure(s) (LRB): TOTAL KNEE ARTHROPLASTY (Left) Past Medical History  Diagnosis Date  . Gastritis     past hx of gastritis -no problem since  . GERD (gastroesophageal reflux disease)   . Arthritis     pain and oa left knee   Principal Problem:   OA (osteoarthritis) of  knee Active Problems:   Postop Hypokalemia  Estimated body mass index is 28.99 kg/(m^2) as calculated from the following:   Height as of this encounter: 5\' 4"  (1.626 m).   Weight as of this encounter: 76.658 kg (169 lb). Up with therapy Discharge home with home health Diet - Regular diet Follow up - in 2 weeks, Tuesday May 13th Activity - WBAT Disposition - Home Condition Upon Discharge - Good D/C Meds - See DC Summary DVT Prophylaxis - Xarelto  Karina Lenderman 07/28/2012, 7:20 AM

## 2012-07-28 NOTE — Progress Notes (Signed)
Physical Therapy Treatment Patient Details Name: Natalie Mcfarland MRN: [id number] DOB: November 13, 1940 Today's Date: 07/28/2012 Time: 4098-1191 PT Time Calculation (min): 60 min  PT Assessment / Plan / Recommendation Comments on Treatment Session  Pt continues to progress well and has performed stairs.  Educated family on how to assist pt with stairs.  Ready for D/c.     Follow Up Recommendations  Home health PT     Does the patient have the potential to tolerate intense rehabilitation     Barriers to Discharge        Equipment Recommendations  Rolling walker with 5" wheels;Other (comment)    Recommendations for Other Services    Frequency 7X/week   Plan Discharge plan remains appropriate    Precautions / Restrictions Precautions Precautions: Fall;Knee Required Braces or Orthoses: Knee Immobilizer - Left Knee Immobilizer - Left: Discontinue once straight leg raise with < 10 degree lag Restrictions Weight Bearing Restrictions: No Other Position/Activity Restrictions: WBAT   Pertinent Vitals/Pain 5/10 pain meds given, ice packs applied    Mobility  Bed Mobility Bed Mobility: Supine to Sit;Sit to Supine Supine to Sit: 4: Min guard;4: Min assist;HOB flat Sit to Supine: 4: Min guard;4: Min assist;HOB flat Details for Bed Mobility Assistance: Continues to require min/guard to min assist for LLE.  Educated family and them practice helping her in/out of bed.  Transfers Transfers: Sit to Stand;Stand to Sit Sit to Stand: 4: Min guard;From bed Stand to Sit: 4: Min guard;To bed Details for Transfer Assistance: Min/guard for safety with min cues for hand placement/LE management when sitting/standing.  Ambulation/Gait Ambulation/Gait Assistance: 5: Supervision Ambulation Distance (Feet): 58 Feet Assistive device: Rolling walker Ambulation/Gait Assistance Details: Continues to require min cues for sequencing/technique.  Gait Pattern: Step-to pattern;Decreased stride length;Antalgic;Trunk  flexed;Decreased weight shift to left Gait velocity: decreased Stairs: Yes Stairs Assistance: 4: Min guard;4: Min assist Stairs Assistance Details (indicate cue type and reason): Assist to steady with cues for sequencing/technique with RW and also educated family on how to assist pt.  Both return understanding.  Stair Management Technique: No rails;Step to pattern;Backwards;Forwards;With walker Number of Stairs: 2 (x2 reps) Wheelchair Mobility Wheelchair Mobility: No    Exercises Total Joint Exercises Ankle Circles/Pumps: AROM;Both;20 reps Quad Sets: AROM;Left;10 reps Heel Slides: AAROM;Left;10 reps Hip ABduction/ADduction: AAROM;Left;10 reps Straight Leg Raises: AAROM;Left;10 reps   PT Diagnosis:    PT Problem List:   PT Treatment Interventions:     PT Goals Acute Rehab PT Goals PT Goal Formulation: With patient Time For Goal Achievement: 07/29/12 Potential to Achieve Goals: Good Pt will go Supine/Side to Sit: with supervision PT Goal: Supine/Side to Sit - Progress: Progressing toward goal Pt will go Sit to Supine/Side: with supervision PT Goal: Sit to Supine/Side - Progress: Progressing toward goal Pt will go Sit to Stand: with supervision PT Goal: Sit to Stand - Progress: Progressing toward goal Pt will go Stand to Sit: with supervision PT Goal: Stand to Sit - Progress: Progressing toward goal Pt will Ambulate: 51 - 150 feet;with supervision;with least restrictive assistive device PT Goal: Ambulate - Progress: Partly met Pt will Go Up / Down Stairs: 1-2 stairs;with min assist;with least restrictive assistive device PT Goal: Up/Down Stairs - Progress: Met  Visit Information  Last PT Received On: 07/28/12 Assistance Needed: +1    Subjective Data  Subjective: I'm sore today.  Patient Stated Goal: to return home.    Cognition  Cognition Arousal/Alertness: Awake/alert Behavior During Therapy: WFL for tasks assessed/performed Overall Cognitive Status: Within  Functional  Limits for tasks assessed    Balance     End of Session PT - End of Session Equipment Utilized During Treatment: Left knee immobilizer Activity Tolerance: Patient tolerated treatment well Patient left: in bed;with call bell/phone within reach Nurse Communication: Mobility status CPM Left Knee CPM Left Knee: Off   GP     Vista Deck 07/28/2012, 10:20 AM

## 2012-07-28 NOTE — Progress Notes (Signed)
Occupational Therapy Treatment Patient Details Name: Natalie Mcfarland MRN: [id number] DOB: 02/05/1941 Today's Date: 07/28/2012 Time: 1024- 1034    OT Assessment / Plan / Recommendation    Follow Up Recommendations  Home health OT                Plan Discharge plan remains appropriate    Precautions / Restrictions Precautions Precautions: Fall;Knee Required Braces or Orthoses: Knee Immobilizer - Left Knee Immobilizer - Left: Discontinue once straight leg raise with < 10 degree lag Restrictions Weight Bearing Restrictions: No Other Position/Activity Restrictions: WBAT       ADL  Transfers/Ambulation Related to ADLs: Pt and daughter interested in tub transfer bench which they have ordered. Pt and daughter educated on use of tub bench.  Pt also informed HH therapist could help with any questions they had when they got home.  OT provided education regarding dressing, safety with toileting and answered many questions in regards to bathing     OT Goals ADL Goals ADL Goal: Tub/Shower Transfer - Progress: Progressing toward goals  Visit Information  Last OT Received On: 07/28/12 Assistance Needed: +1    Subjective Data  Subjective: I want to discuss tub transfer   Prior Functioning       Cognition  Cognition Arousal/Alertness: Awake/alert Behavior During Therapy: WFL for tasks assessed/performed Overall Cognitive Status: Within Functional Limits for tasks assessed    Mobility  Bed Mobility Bed Mobility: Supine to Sit;Sit to Supine Supine to Sit: 4: Min guard;4: Min assist;HOB flat Sit to Supine: 4: Min guard;4: Min assist;HOB flat Details for Bed Mobility Assistance: Continues to require min/guard to min assist for LLE.  Educated family and them practice helping her in/out of bed.  Transfers Sit to Stand: 4: Min guard;From bed Stand to Sit: 4: Min guard;To bed Details for Transfer Assistance: Min/guard for safety with min cues for hand placement/LE management when  sitting/standing.     Exercises  Total Joint Exercises Ankle Circles/Pumps: AROM;Both;20 reps Quad Sets: AROM;Left;10 reps Heel Slides: AAROM;Left;10 reps Hip ABduction/ADduction: AAROM;Left;10 reps Straight Leg Raises: AAROM;Left;10 reps      End of Session OT - End of Session Patient left: in bed;with family/visitor present;with call bell/phone within reach CPM Left Knee CPM Left Knee: Off  GO     Alba Cory 07/28/2012, 10:57 AM

## 2012-07-28 NOTE — Care Management Note (Signed)
    Page 1 of 2   07/28/2012     2:51:32 PM   CARE MANAGEMENT NOTE 07/28/2012  Patient:  Natalie Mcfarland, Natalie Mcfarland   Account Number:  [id number]  Date Initiated:  07/26/2012  Documentation initiated by:  [facility]  Subjective/Objective Assessment:   72 year old female admitted s/p left knee replacement.     Action/Plan:   Home with HHPT services at d/c.   Anticipated DC Date:  07/29/2012   Anticipated DC Plan:  HOME W HOME HEALTH SERVICES      DC Planning Services  CM consult      PAC Choice  DURABLE MEDICAL EQUIPMENT  HOME HEALTH   Choice offered to / List presented to:  C-1 Patient   DME arranged  WALKER - ROLLING  BEDSIDE COMMODE      DME agency  [facility]     HH arranged  HH-2 PT      [facility] agency  [facility]   Status of service:  Completed, signed off Medicare Important Message given?  NA - LOS <3 / Initial given by admissions (If response is "NO", the following Medicare IM given date fields will be blank) Date Medicare IM given:   Date Additional Medicare IM given:    Discharge Disposition:  HOME W HOME HEALTH SERVICES  Per UR Regulation:  Reviewed for med. necessity/level of care/duration of stay  If discussed at Long Length of Stay Meetings, dates discussed:    Comments:  07/28/2012 Colleen Can BSN RN CCM [phone] Pt  plans home today with [facility] services in place. HH services will start tomorow 07/29/2012.

## 2012-07-29 MED FILL — Promethazine HCl Inj 25 MG/ML: INTRAMUSCULAR | Qty: 1 | Status: AC

## 2013-02-03 ENCOUNTER — Ambulatory Visit
Admission: RE | Admit: 2013-02-03 | Discharge: 2013-02-03 | Disposition: A | Discharge status: Home / Self Care | Payer: Medicare PPO | Source: Ambulatory Visit | Attending: Surgical | Admitting: Surgical

## 2013-02-03 ENCOUNTER — Other Ambulatory Visit: Payer: Self-pay | Admitting: Surgical

## 2013-02-03 DIAGNOSIS — M25552 Pain in left hip: Secondary | ICD-10-CM

## 2013-02-09 ENCOUNTER — Emergency Department (HOSPITAL_COMMUNITY)
Admission: EM | Admit: 2013-02-09 | Discharge: 2013-02-09 | Disposition: A | Discharge status: Home / Self Care | Payer: Medicare PPO | Attending: Emergency Medicine | Admitting: Emergency Medicine

## 2013-02-09 ENCOUNTER — Encounter (HOSPITAL_COMMUNITY): Payer: Self-pay | Admitting: Emergency Medicine

## 2013-02-09 DIAGNOSIS — T23109A Burn of first degree of unspecified hand, unspecified site, initial encounter: Secondary | ICD-10-CM | POA: Insufficient documentation

## 2013-02-09 DIAGNOSIS — Z23 Encounter for immunization: Secondary | ICD-10-CM | POA: Insufficient documentation

## 2013-02-09 DIAGNOSIS — T23102A Burn of first degree of left hand, unspecified site, initial encounter: Secondary | ICD-10-CM

## 2013-02-09 DIAGNOSIS — Y939 Activity, unspecified: Secondary | ICD-10-CM | POA: Insufficient documentation

## 2013-02-09 DIAGNOSIS — Z79899 Other long term (current) drug therapy: Secondary | ICD-10-CM | POA: Insufficient documentation

## 2013-02-09 DIAGNOSIS — Z8739 Personal history of other diseases of the musculoskeletal system and connective tissue: Secondary | ICD-10-CM | POA: Insufficient documentation

## 2013-02-09 DIAGNOSIS — X12XXXA Contact with other hot fluids, initial encounter: Secondary | ICD-10-CM | POA: Insufficient documentation

## 2013-02-09 DIAGNOSIS — Y929 Unspecified place or not applicable: Secondary | ICD-10-CM | POA: Insufficient documentation

## 2013-02-09 DIAGNOSIS — Z8719 Personal history of other diseases of the digestive system: Secondary | ICD-10-CM | POA: Insufficient documentation

## 2013-02-09 MED ORDER — ONDANSETRON 4 MG PO TBDP
4.0000 mg | ORAL_TABLET | Freq: Once | ORAL | Status: AC
  Administered 2013-02-09: 4 mg via ORAL
  Filled 2013-02-09: qty 1

## 2013-02-09 MED ORDER — TETANUS-DIPHTH-ACELL PERTUSSIS 5-2.5-18.5 LF-MCG/0.5 IM SUSP
0.5000 mL | Freq: Once | INTRAMUSCULAR | Status: AC
  Administered 2013-02-09: 0.5 mL via INTRAMUSCULAR
  Filled 2013-02-09: qty 0.5

## 2013-02-09 MED ORDER — HYDROCODONE-ACETAMINOPHEN 5-325 MG PO TABS
1.0000 | ORAL_TABLET | Freq: Once | ORAL | Status: AC
  Administered 2013-02-09: 1 via ORAL
  Filled 2013-02-09: qty 1

## 2013-02-09 MED ORDER — HYDROCODONE-ACETAMINOPHEN 5-325 MG PO TABS
1.0000 | ORAL_TABLET | ORAL | Status: DC | PRN
Start: 2013-02-09 — End: 2013-02-09

## 2013-02-09 MED ORDER — SILVER SULFADIAZINE 1 % EX CREA: 1.0000 "application " | TOPICAL_CREAM | Freq: Every day | CUTANEOUS | Status: DC

## 2013-02-09 MED ORDER — HYDROCODONE-ACETAMINOPHEN 5-325 MG PO TABS: 1.0000 | ORAL_TABLET | ORAL | Status: DC | PRN

## 2013-02-09 MED ORDER — SILVER SULFADIAZINE 1 % EX CREA
TOPICAL_CREAM | Freq: Once | CUTANEOUS | Status: AC
  Administered 2013-02-09: 16:00:00 via TOPICAL
  Filled 2013-02-09: qty 85
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
## 2013-02-09 NOTE — ED Notes (Signed)
Pt's hand applied with silvadene cream. Pt reports immediate relief from pain. Hand wrapped in gauze and then crillex. Detailed instructions for hand care, pain control and follow up given to pt and husband.
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
## 2013-02-14 DIAGNOSIS — L299 Pruritus, unspecified: Secondary | ICD-10-CM | POA: Insufficient documentation

## 2013-02-14 DIAGNOSIS — T23269A Burn of second degree of back of unspecified hand, initial encounter: Secondary | ICD-10-CM | POA: Insufficient documentation

## 2013-06-24 ENCOUNTER — Encounter (HOSPITAL_COMMUNITY): Payer: Self-pay | Admitting: Emergency Medicine

## 2013-06-24 ENCOUNTER — Emergency Department (HOSPITAL_COMMUNITY)
Admission: EM | Admit: 2013-06-24 | Discharge: 2013-06-24 | Disposition: A | Discharge status: Home / Self Care | Payer: Medicare PPO | Attending: Emergency Medicine | Admitting: Emergency Medicine

## 2013-06-24 DIAGNOSIS — Z8719 Personal history of other diseases of the digestive system: Secondary | ICD-10-CM | POA: Insufficient documentation

## 2013-06-24 DIAGNOSIS — J209 Acute bronchitis, unspecified: Secondary | ICD-10-CM | POA: Insufficient documentation

## 2013-06-24 DIAGNOSIS — Z79899 Other long term (current) drug therapy: Secondary | ICD-10-CM | POA: Insufficient documentation

## 2013-06-24 DIAGNOSIS — M1710 Unilateral primary osteoarthritis, unspecified knee: Secondary | ICD-10-CM | POA: Insufficient documentation

## 2013-06-24 DIAGNOSIS — IMO0002 Reserved for concepts with insufficient information to code with codable children: Secondary | ICD-10-CM | POA: Insufficient documentation

## 2013-06-24 DIAGNOSIS — S025XXA Fracture of tooth (traumatic), initial encounter for closed fracture: Secondary | ICD-10-CM | POA: Insufficient documentation

## 2013-06-24 DIAGNOSIS — Z7901 Long term (current) use of anticoagulants: Secondary | ICD-10-CM | POA: Insufficient documentation

## 2013-06-24 DIAGNOSIS — Y939 Activity, unspecified: Secondary | ICD-10-CM | POA: Insufficient documentation

## 2013-06-24 DIAGNOSIS — K029 Dental caries, unspecified: Secondary | ICD-10-CM | POA: Insufficient documentation

## 2013-06-24 DIAGNOSIS — Y929 Unspecified place or not applicable: Secondary | ICD-10-CM | POA: Insufficient documentation

## 2013-06-24 DIAGNOSIS — X58XXXA Exposure to other specified factors, initial encounter: Secondary | ICD-10-CM | POA: Insufficient documentation

## 2013-06-24 NOTE — ED Provider Notes (Signed)
  Medical screening examination/treatment/procedure(s) were performed by non-physician practitioner and as supervising physician I was immediately available for consultation/collaboration.   EKG Interpretation None         Carmin Muskrat, MD 06/24/13 [phone]

## 2013-06-24 NOTE — ED Notes (Signed)
PA at bedside.

## 2013-06-24 NOTE — Discharge Instructions (Signed)
Please continue to take Levofloxacin as previously prescribed by your doctor.  Follow up with your dentist on Monday for further management of your dental avulsion.    Dental Fracture You have a dental fracture or injury. This can mean the tooth is loose, has a chip in the enamel or is broken. If just the outer enamel is chipped, there is a good chance the tooth will not become infected. The only treatment needed may be to smooth off a rough edge. Fractures into the deeper layers (dentin and pulp) cause greater pain and are more likely to become infected. These require you to see a dentist as soon as possible to save the tooth. Loose teeth may need to be wired or bonded with a plastic splint to hold them in place. A paste may be painted on the open area of the broken tooth to reduce the pain. Antibiotics and pain medicine may be prescribed. Choosing a soft or liquid diet and rinsing the mouth out with warm water after meals may be helpful. See your dentist as recommended. Failure to seek care or follow up with a dentist or other specialist as recommended could result in the loss of your tooth, infection, or permanent dental problems. SEEK MEDICAL CARE IF:   You have increased pain not controlled with medicines.  You have swelling around the tooth, in the face or neck.  You have bleeding which starts, continues, or gets worse.  You have a fever. Document Released: 04/23/2004 Document Revised: 06/08/2011 Document Reviewed: 02/05/2009 [facility] Patient Information 2014 [place], [place].

## 2013-06-24 NOTE — ED Provider Notes (Signed)
CSN: [id number]     Arrival date & time 06/24/13  1343 History   First MD Initiated Contact with Patient 06/24/13 1351    This chart was scribed for Domenic Moras PA-C, a non-physician practitioner working with Carmin Muskrat, MD by Denice Bors, ED Scribe. This patient was seen in room TR05C/TR05C and the patient's care was started at 2:15 PM      Chief Complaint  Patient presents with  . Dental Injury     (Consider location/radiation/quality/duration/timing/severity/associated sxs/prior Treatment) The history is provided by the patient. No language interpreter was used.   HPI Comments: Natalie Mcfarland is a 73 y.o. female who presents to the Emergency Department complaining of constant moderate right lower dental pain onset this morning. Reports she is currently taking Levofloxacin abx for bronchitis. Describes "sand like feeling in mouth", pressure, and sensitivity. Reports pain is exacerbated by swallowing fluids and talking. Reports pain is mildly alleviated at rest (not talking/eating). Denies associated recent trauma, fever, dysphagia, and sore throat. Denies PMHx of the same. Reports PMHx of dental fillings.   Past Medical History  Diagnosis Date  . Gastritis     past hx of gastritis -no problem since  . GERD (gastroesophageal reflux disease)   . Arthritis     pain and oa left knee   Past Surgical History  Procedure Laterality Date  . Esophagogastroduodenoscopy endoscopy    . 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 No pertinent family history. History  Substance Use Topics  . Smoking status: Never Smoker   . Smokeless tobacco: Not on file  . Alcohol Use: No   OB History   Grav Para Term Preterm Abortions TAB SAB Ect Mult Living                 Review of Systems  Constitutional: Negative for fever.  HENT: Positive  for dental problem. Negative for ear pain, sore throat and trouble swallowing.   Psychiatric/Behavioral: Negative for confusion.      Allergies  Review of patient's allergies indicates no known allergies.  Home Medications   Current Outpatient Rx  Name  Route  Sig  Dispense  Refill  . HYDROcodone-acetaminophen (NORCO/VICODIN) 5-325 MG per tablet   Oral   Take 1 tablet by mouth every 4 (four) hours as needed for severe pain.   10 tablet   0   . loratadine (CLARITIN) 10 MG tablet   Oral   Take 10 mg by mouth daily.         . methocarbamol (ROBAXIN) 500 MG tablet   Oral   Take 1 tablet (500 mg total) by mouth every 6 (six) hours as needed.   80 tablet   1   . oxyCODONE (OXY IR/ROXICODONE) 5 MG immediate release tablet   Oral   Take 1-2 tablets (5-10 mg total) by mouth every 3 (three) hours as needed.   80 tablet   0   . rivaroxaban (XARELTO) 10 MG TABS tablet   Oral   Take 1 tablet (10 mg total) by mouth daily with breakfast. Take Xarelto for two and a half more weeks, then discontinue Xarelto.   18 tablet   0   . silver sulfADIAZINE (SILVADENE) 1 % cream   Topical   Apply 1 application topically daily.   50 g   0   . traMADol (ULTRAM)  50 MG tablet   Oral   Take 1-2 tablets (50-100 mg total) by mouth every 6 (six) hours as needed.   80 tablet   1    BP 127/66  Pulse 57  Temp(Src) 98.4 F (36.9 C) (Oral)  Resp 18  Ht 5\' 4"  (1.626 m)  Wt 160 lb (72.576 kg)  BMI 27.45 kg/m2  SpO2 99% Physical Exam  Nursing note and vitals reviewed. Constitutional: She is oriented to person, place, and time. She appears well-developed and well-nourished. No distress.  HENT:  Head: Normocephalic and atraumatic.  Mouth/Throat: Uvula is midline and oropharynx is clear and moist. No trismus in the jaw. Dental caries present. No uvula swelling. No oropharyngeal exudate.    Exposed dentin in number 30  No signs of peritonsillar or tonsillar abscess. No signs of gingival  abscess. Oropharynx is clear and without exudates.  Uvula is midline.  Airway is intact. No signs of Ludwig's angina with palpation of the oral and sublingual mucosa.    Eyes: EOM are normal.  Neck: Neck supple. No tracheal deviation present.  Cardiovascular: Normal rate.   Pulmonary/Chest: Effort normal. No respiratory distress.  Musculoskeletal: Normal range of motion.  Neurological: She is alert and oriented to person, place, and time.  Skin: Skin is warm and dry.  Psychiatric: She has a normal mood and affect. Her behavior is normal.    ED Course  Dental Date/Time: 06/24/2013 3:37 PM Performed by: Domenic Moras Authorized by: Domenic Moras Consent: Verbal consent obtained. Risks and benefits: risks, benefits and alternatives were discussed Consent given by: patient Patient understanding: patient states understanding of the procedure being performed Patient identity confirmed: verbally with patient and arm band Time out: Immediately prior to procedure a "time out" was called to verify the correct patient, procedure, equipment, support staff and site/side marked as required. Local anesthesia used: no Patient sedated: no Patient tolerance: Patient tolerated the procedure well with no immediate complications. Comments: Patient with partial dental avulsion to teeth #30.  Fragment of tooth were removed with sterile pickup.  Temrex dental cement were mixed and applied.  Time were spent to harden material and shaped to the best of my ability to provide protection and comfort.  Pt tolerates well.      COORDINATION OF CARE:  Nursing notes reviewed. Vital signs reviewed. Initial pt interview and examination performed.   2:29 PM-Discussed treatment plan with pt at bedside. Pt agrees with plan.  4:02 PM Pt with dental avulsion, which i was able to removed fragment of tooth and provide temporary fill in.  Pt does have a dentist in which she will follow up closely for further management.  Pt  has abx (levaquin) at home which she is currently taking.       Treatment plan initiated:Medications - No data to display   Initial diagnostic testing ordered.     Labs Review Labs Reviewed - No data to display Imaging Review No results found.   EKG Interpretation None      MDM   Final diagnoses:  Avulsion fracture of tooth    BP 131/74  Pulse 55  Temp(Src) 98.4 F (36.9 C) (Oral)  Resp 20  Ht 5\' 4"  (1.626 m)  Wt 160 lb (72.576 kg)  BMI 27.45 kg/m2  SpO2 97%  I personally performed the services described in this documentation, which was scribed in my presence. The recorded information has been reviewed and is accurate.     Domenic Moras, PA-C 06/24/13 [phone]

## 2013-06-24 NOTE — ED Notes (Signed)
Pt reports teeth feel like they are crumbling.

## 2013-07-03 ENCOUNTER — Emergency Department (HOSPITAL_COMMUNITY)
Admission: EM | Admit: 2013-07-03 | Discharge: 2013-07-03 | Disposition: A | Discharge status: Home / Self Care | Payer: Medicare PPO | Attending: Emergency Medicine | Admitting: Emergency Medicine

## 2013-07-03 ENCOUNTER — Emergency Department (HOSPITAL_COMMUNITY): Payer: Medicare PPO

## 2013-07-03 ENCOUNTER — Encounter (HOSPITAL_COMMUNITY): Payer: Self-pay | Admitting: Emergency Medicine

## 2013-07-03 DIAGNOSIS — R51 Headache: Secondary | ICD-10-CM | POA: Insufficient documentation

## 2013-07-03 DIAGNOSIS — Z79899 Other long term (current) drug therapy: Secondary | ICD-10-CM | POA: Insufficient documentation

## 2013-07-03 DIAGNOSIS — R519 Headache, unspecified: Secondary | ICD-10-CM

## 2013-07-03 DIAGNOSIS — Z8719 Personal history of other diseases of the digestive system: Secondary | ICD-10-CM | POA: Insufficient documentation

## 2013-07-03 DIAGNOSIS — Z8739 Personal history of other diseases of the musculoskeletal system and connective tissue: Secondary | ICD-10-CM | POA: Insufficient documentation

## 2013-07-03 NOTE — ED Notes (Signed)
Pt eval per dr Venora Maples

## 2013-07-03 NOTE — ED Notes (Signed)
Pt refusing MRI. Notified Dr. Venora Maples. Dr. Venora Maples gave RN discharge instructions to give to pt once returned from MRI. Pt states she doesn't know why she needs to be here- she says she only has a cold/sore throat. Pt is ambulatory and neurally intact.  RN went to room to give pt discharge instructions and pt and pt's husband were gone. Terrance RN met pt at waiting room to give discharge instructions. Unable to obtain signature from pt. Pt unwilling to wait for discharge vital signs.

## 2013-07-03 NOTE — ED Provider Notes (Addendum)
CSN: [id number]     Arrival date & time 07/03/13  1345 History   First MD Initiated Contact with Patient 07/03/13 1427     Chief Complaint  Patient presents with  . Facial Droop      HPI Patient developed a right-sided facial droop yesterday with questionable swelling of her right cheek.  She denies dental pain.  No weakness of her arms or legs.  She does report some tearing from her right eye but states this is more of a chronic issue.  No changes in her taste.  She spoke with her family members who are physicians and they're concerned about the possibility of Bell's palsy vs stroke.  Her spouse reports some ongoing right-sided facial droop this time.  No prior stroke.    Past Medical History  Diagnosis Date  . Gastritis     past hx of gastritis -no problem since  . GERD (gastroesophageal reflux disease)   . Arthritis     pain and oa left knee   Past Surgical History  Procedure Laterality Date  . Esophagogastroduodenoscopy endoscopy    . 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 History  Substance Use Topics  . Smoking status: Never Smoker   . Smokeless tobacco: Not on file  . Alcohol Use: No   OB History   Grav Para Term Preterm Abortions TAB SAB Ect Mult Living                 Review of Systems  All other systems reviewed and are negative.      Allergies  Other  Home Medications   Current Outpatient Rx  Name  Route  Sig  Dispense  Refill  . levofloxacin (LEVAQUIN) 500 MG tablet   Oral   Take 500 mg by mouth daily. 10 day course started 06/22/13         . loratadine (CLARITIN) 10 MG tablet   Oral   Take 10 mg by mouth daily.         . Omega-3 Fatty Acids (OMEGA 3 PO)   Oral   Take 1 capsule by mouth daily.          BP 118/97  Pulse 59  Temp(Src) 98.1 F (36.7 C) (Oral)  Resp 18  Ht 5\' 2"   (1.575 m)  Wt 172 lb 3.2 oz (78.109 kg)  BMI 31.49 kg/m2  SpO2 95% Physical Exam  Nursing note and vitals reviewed. Constitutional: She is oriented to person, place, and time. She appears well-developed and well-nourished. No distress.  HENT:  Head: Normocephalic and atraumatic.  Eyes: EOM are normal. Pupils are equal, round, and reactive to light.  Neck: Normal range of motion.  Cardiovascular: Normal rate, regular rhythm and normal heart sounds.   Pulmonary/Chest: Effort normal and breath sounds normal.  Abdominal: Soft. She exhibits no distension. There is no tenderness.  Musculoskeletal: Normal range of motion.  Neurological: She is alert and oriented to person, place, and time.  5/5 strength in major muscle groups of  bilateral upper and lower extremities. Speech normal.  Slight right-sided facial droop.  No weakness of her forehead  Skin: Skin is warm and dry.  Psychiatric: She has a normal mood and affect. Judgment normal.    ED Course  Procedures (including critical care time) Labs Review Labs Reviewed - No data  to display Imaging Review No results found.   EKG Interpretation None      MDM   Final diagnoses:  None    Patient will undergo MRI to evaluate for the possibility of stroke.  Doubt Bell's palsy.  There is no obvious dental injury/abscess/infection this time.  No erythema redness of her face to suggest cellulitis.  No fluctuance to suggest abscess.  3:52 PM Care to Dr Dorna Mai. Awaiting MRI. If negative, home with pcp follow up.     Hoy Morn, MD 07/03/13 1552  4:39 PM Patient no longer wants an MRI.  She went all the way to the MRI scanner and would like to go home at this time.  She states she does not think that she needs an MRI..  I had a long discussion with the patient and the patient's husband describe the importance of an MRI.  She wishes not to have this done.  I told her take 81 mg aspirin every day and followup with her primary care  physician.  I asked that she return to the ER for any new or worsening symptoms.  I also asked that she follow up closely with her doctor.  Hoy Morn, MD 07/03/13 1640

## 2013-07-03 NOTE — ED Notes (Signed)
Dr. Venora Maples at bedside speaking with pt about importance of staying to have MRI. Pt agreeable. Pt in gown and awaiting MRI.

## 2013-07-03 NOTE — ED Notes (Signed)
Pt transported to MRI 

## 2013-07-03 NOTE — ED Notes (Signed)
Spoke to pt and husband (Dr. Carson Myrtle) about need for MRI to be done.  Pt feels like she just has a cold and needs to go home and rest; stated that she would follow up with PCP but tired of waiting.  Husband asked if there were any problems could they come back; iterated several times that if they needed to come back to please come back to see [place].  Husband seemed understanding and thanked me for trying to help.

## 2013-07-03 NOTE — ED Notes (Addendum)
Rt side  Facial droop and numbness since yesterday  May be better today she states has been tired also  Rt side of mouth numb No slurred speech or any other weakness   Had some some dental issues last week and saw her ddentist also had some ear pain took some antibiotics

## 2014-07-12 DIAGNOSIS — R05 Cough: Secondary | ICD-10-CM | POA: Diagnosis not present

## 2014-07-12 DIAGNOSIS — R490 Dysphonia: Secondary | ICD-10-CM | POA: Diagnosis not present

## 2014-07-12 DIAGNOSIS — J029 Acute pharyngitis, unspecified: Secondary | ICD-10-CM | POA: Diagnosis not present

## 2014-07-16 DIAGNOSIS — H04123 Dry eye syndrome of bilateral lacrimal glands: Secondary | ICD-10-CM | POA: Diagnosis not present

## 2014-07-16 DIAGNOSIS — H01002 Unspecified blepharitis right lower eyelid: Secondary | ICD-10-CM | POA: Diagnosis not present

## 2014-07-16 DIAGNOSIS — H01001 Unspecified blepharitis right upper eyelid: Secondary | ICD-10-CM | POA: Diagnosis not present

## 2014-07-16 DIAGNOSIS — H01004 Unspecified blepharitis left upper eyelid: Secondary | ICD-10-CM | POA: Diagnosis not present

## 2014-07-27 DIAGNOSIS — Z0000 Encounter for general adult medical examination without abnormal findings: Secondary | ICD-10-CM | POA: Diagnosis not present

## 2014-07-27 DIAGNOSIS — H2513 Age-related nuclear cataract, bilateral: Secondary | ICD-10-CM | POA: Diagnosis not present

## 2014-07-27 DIAGNOSIS — H43813 Vitreous degeneration, bilateral: Secondary | ICD-10-CM | POA: Diagnosis not present

## 2014-07-27 DIAGNOSIS — H01001 Unspecified blepharitis right upper eyelid: Secondary | ICD-10-CM | POA: Diagnosis not present

## 2014-07-27 DIAGNOSIS — E785 Hyperlipidemia, unspecified: Secondary | ICD-10-CM | POA: Diagnosis not present

## 2014-07-27 DIAGNOSIS — D649 Anemia, unspecified: Secondary | ICD-10-CM | POA: Diagnosis not present

## 2014-07-27 DIAGNOSIS — H04123 Dry eye syndrome of bilateral lacrimal glands: Secondary | ICD-10-CM | POA: Diagnosis not present

## 2014-08-21 ENCOUNTER — Other Ambulatory Visit: Payer: Self-pay | Admitting: Internal Medicine

## 2014-08-21 DIAGNOSIS — R51 Headache: Principal | ICD-10-CM

## 2014-08-21 DIAGNOSIS — R519 Headache, unspecified: Secondary | ICD-10-CM

## 2014-09-20 DIAGNOSIS — R21 Rash and other nonspecific skin eruption: Secondary | ICD-10-CM | POA: Diagnosis not present

## 2014-12-04 DIAGNOSIS — E559 Vitamin D deficiency, unspecified: Secondary | ICD-10-CM | POA: Diagnosis not present

## 2014-12-04 DIAGNOSIS — K1120 Sialoadenitis, unspecified: Secondary | ICD-10-CM | POA: Diagnosis not present

## 2014-12-04 DIAGNOSIS — Z23 Encounter for immunization: Secondary | ICD-10-CM | POA: Diagnosis not present

## 2015-02-12 DIAGNOSIS — J0111 Acute recurrent frontal sinusitis: Secondary | ICD-10-CM | POA: Diagnosis not present

## 2015-07-05 DIAGNOSIS — H60312 Diffuse otitis externa, left ear: Secondary | ICD-10-CM | POA: Insufficient documentation

## 2015-08-16 DIAGNOSIS — J302 Other seasonal allergic rhinitis: Secondary | ICD-10-CM | POA: Insufficient documentation

## 2016-02-27 ENCOUNTER — Other Ambulatory Visit (HOSPITAL_COMMUNITY): Payer: Self-pay | Admitting: Respiratory Therapy

## 2016-02-27 DIAGNOSIS — R05 Cough: Secondary | ICD-10-CM

## 2016-02-27 DIAGNOSIS — R053 Chronic cough: Secondary | ICD-10-CM

## 2016-08-11 ENCOUNTER — Emergency Department (HOSPITAL_COMMUNITY)
Admission: EM | Admit: 2016-08-11 | Discharge: 2016-08-12 | Disposition: A | Discharge status: Home / Self Care | Payer: Medicare Other | Attending: Emergency Medicine | Admitting: Emergency Medicine

## 2016-08-11 ENCOUNTER — Emergency Department (HOSPITAL_COMMUNITY): Payer: Medicare Other

## 2016-08-11 ENCOUNTER — Encounter (HOSPITAL_COMMUNITY): Payer: Self-pay | Admitting: Emergency Medicine

## 2016-08-11 DIAGNOSIS — S60222A Contusion of left hand, initial encounter: Secondary | ICD-10-CM | POA: Diagnosis not present

## 2016-08-11 DIAGNOSIS — Y93B9 Activity, other involving muscle strengthening exercises: Secondary | ICD-10-CM | POA: Insufficient documentation

## 2016-08-11 DIAGNOSIS — Z96651 Presence of right artificial knee joint: Secondary | ICD-10-CM | POA: Insufficient documentation

## 2016-08-11 DIAGNOSIS — W208XXA Other cause of strike by thrown, projected or falling object, initial encounter: Secondary | ICD-10-CM | POA: Insufficient documentation

## 2016-08-11 DIAGNOSIS — Y999 Unspecified external cause status: Secondary | ICD-10-CM | POA: Insufficient documentation

## 2016-08-11 DIAGNOSIS — Y9289 Other specified places as the place of occurrence of the external cause: Secondary | ICD-10-CM | POA: Insufficient documentation

## 2016-08-11 DIAGNOSIS — S6991XA Unspecified injury of right wrist, hand and finger(s), initial encounter: Secondary | ICD-10-CM | POA: Diagnosis present

## 2016-08-11 NOTE — ED Triage Notes (Signed)
Pt presents with L hand swelling and bruising d/t her working out and dropped weight on hand;

## 2016-08-11 NOTE — ED Provider Notes (Signed)
[facility] DEPT Provider Note   CSN: [id number] Arrival date & time: 08/11/16  2139  By signing my name below, I, Levester Fresh, attest that this documentation has been prepared under the direction and in the presence of Horton, Barbette Hair, MD . Electronically Signed: Levester Fresh, Scribe. 08/12/2016. 12:26 AM.   History   Chief Complaint Chief Complaint  Patient presents with  . Hand Injury   Natalie Mcfarland is a 76 y.o. female with no significant PMHx, who presents to the Emergency Department with complaints of left hand pain, after dropping a weight on it while exercising. Pain is currently rated a 8/10 in severity. Sensation intact to fingers. Able to move hand and digits.   Pt has no other complaints, and denies experiencing any other acute sx, including numbness or weakness. Pt is right handed.   The history is provided by the patient and medical records. No language interpreter was used.    Past Medical History:  Diagnosis Date  . Arthritis    pain and oa left knee  . Gastritis    past hx of gastritis -no problem since  . GERD (gastroesophageal reflux disease)     Patient Active Problem List   Diagnosis Date Noted  . Postop Hypokalemia 07/28/2012  . OA (osteoarthritis) of knee 07/25/2012    Past Surgical History:  Procedure Laterality Date  . ESOPHAGOGASTRODUODENOSCOPY ENDOSCOPY    . 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 Authorizing Provider  ibuprofen (ADVIL,MOTRIN) 400 MG tablet Take 1 tablet (400 mg total) by mouth every 6 (six) hours as needed. Limit use to 2-3 days 08/12/16   Horton, Barbette Hair, MD  levofloxacin (LEVAQUIN) 500 MG tablet Take 500 mg by mouth daily. 10 day course  started 06/22/13 06/16/13   [provider]  loratadine (CLARITIN) 10 MG tablet Take 10 mg by mouth daily.    [provider]    Family History History reviewed. No pertinent family history.  Social History Social History  Substance Use Topics  . Smoking status: Never Smoker  . Smokeless tobacco: Never Used  . Alcohol use No     Allergies   Other   Review of Systems Review of Systems  Constitutional: Negative for fever.  Musculoskeletal: Positive for arthralgias and joint swelling.  Skin: Positive for wound.  Neurological: Negative for weakness and numbness.  All other systems reviewed and are negative.   Physical Exam Updated Vital Signs BP (!) 148/78 (BP Location: Right Arm)   Pulse 60   Temp 98.4 F (36.9 C) (Oral)   Resp 16   SpO2 100%   Physical Exam  Constitutional: She is oriented to person, place, and time. She appears well-developed and well-nourished.  HENT:  Head: Normocephalic and atraumatic.  Cardiovascular: Normal rate, regular rhythm and normal heart sounds.   Pulmonary/Chest: Effort normal. No respiratory distress. She has no wheezes.  Musculoskeletal:  Extensive bruising over the dorsum of the left hand with tenderness to palpation, no deformities, normal range of motion of the fingers and wrist, 2+ radial pulse  Neurological: She is alert and oriented to person, place, and time.  Skin: Skin is warm and dry.  Psychiatric:  She has a normal mood and affect.  Nursing note and vitals reviewed.    ED Treatments / Results  DIAGNOSTIC STUDIES: Oxygen Saturation is 100% on RA, nl by my interpretation.    COORDINATION OF CARE: 12:10 AM Discussed treatment plan with pt at bedside and pt agreed to plan. Updated her on radiology findings.   Labs (all labs ordered are listed, but only abnormal results are displayed) Labs Reviewed - No data to display  EKG  EKG Interpretation None       Radiology Dg Hand Complete  Left  Result Date: 08/11/2016 CLINICAL DATA:  Right hand trauma with pain and hematoma. Pain localized to the third, fourth and fifth metacarpals. EXAM: LEFT HAND - COMPLETE 3+ VIEW COMPARISON:  None. FINDINGS: There is mild osteopenia. There is no fracture or dislocation of the left hand. Joint spaces are normal. There is moderate dorsal soft tissue swelling. IMPRESSION: Osteopenia, dorsal soft tissue swelling, but no acute fracture or dislocation. Electronically Signed   By: Ulyses Jarred M.D.   On: 08/11/2016 22:13    Procedures Procedures (including critical care time)  Medications Ordered in ED Medications  naproxen (NAPROSYN) tablet 500 mg (not administered)     Initial Impression / Assessment and Plan / ED Course  I have reviewed the triage vital signs and the nursing notes.  Pertinent labs & imaging results that were available during my care of the patient were reviewed by me and considered in my medical decision making (see chart for details).     Patient presents with isolated injury to the left hand. X-rays are negative for fracture. She's neurovascularly intact. Suspect contusion. Recommend ice and compression. Ibuprofen as needed for pain but limit to 2-3 days. Patient reassured.  After history, exam, and medical workup I feel the patient has been appropriately medically screened and is safe for discharge home. Pertinent diagnoses were discussed with the patient. Patient was given return precautions.   Final Clinical Impressions(s) / ED Diagnoses   Final diagnoses:  Contusion of left hand, initial encounter    New Prescriptions New Prescriptions   IBUPROFEN (ADVIL,MOTRIN) 400 MG TABLET    Take 1 tablet (400 mg total) by mouth every 6 (six) hours as needed. Limit use to 2-3 days   I personally performed the services described in this documentation, which was scribed in my presence. The recorded information has been reviewed and is accurate.     Merryl Hacker,  MD 08/12/16 [phone]

## 2016-08-11 NOTE — ED Notes (Signed)
Patient sitting in waiting area holding cell phone with left hand and typing rapidly on screen with right. No apparent discomfort or difficulty with performing this task.

## 2016-08-12 MED ORDER — NAPROXEN 250 MG PO TABS
500.0000 mg | ORAL_TABLET | Freq: Once | ORAL | Status: AC
  Administered 2016-08-12: 500 mg via ORAL
  Filled 2016-08-12: qty 2

## 2016-08-12 MED ORDER — IBUPROFEN 400 MG PO TABS: 400.0000 mg | ORAL_TABLET | Freq: Four times a day (QID) | ORAL | 0 refills | Status: DC | PRN

## 2016-08-12 NOTE — ED Notes (Signed)
ED Provider at bedside. 

## 2016-08-12 NOTE — Discharge Instructions (Signed)
You were seen today for injuries to the left hand. Your x-rays are negative. He likely has extensive bruising. Using ice and compression. You will likely see improvement in 2-3 days. Ibuprofen as needed for pain. Limit used 2-3 days.

## 2017-03-14 ENCOUNTER — Other Ambulatory Visit: Payer: Self-pay

## 2017-03-14 ENCOUNTER — Emergency Department (HOSPITAL_COMMUNITY)
Admission: EM | Admit: 2017-03-14 | Discharge: 2017-03-14 | Discharge status: Against Medical Advice | Payer: Medicare Other | Attending: Emergency Medicine | Admitting: Emergency Medicine

## 2017-03-14 ENCOUNTER — Encounter (HOSPITAL_COMMUNITY): Payer: Self-pay | Admitting: Emergency Medicine

## 2017-03-14 DIAGNOSIS — M545 Low back pain: Secondary | ICD-10-CM | POA: Diagnosis present

## 2017-03-14 DIAGNOSIS — Z96652 Presence of left artificial knee joint: Secondary | ICD-10-CM | POA: Insufficient documentation

## 2017-03-14 NOTE — ED Provider Notes (Signed)
[facility] EMERGENCY DEPARTMENT Provider Note   CSN: [id number] Arrival date & time: 03/14/17  1412     History   Chief Complaint Chief Complaint  Patient presents with  . Back Pain    HPI Natalie Mcfarland is a 76 y.o. female.  HPI  Patient is a 75 year old female with a history of osteoarthritis of the left knee, gastritis, and GERD presenting for bilateral lower back pain.  Patient reports she is presenting today to request to be seen by a spine specialist.  Patient reports that approximately 1 month ago she had a fall onto the cement when she fell onto her lower back.  There was no head trauma with this incident.  Subsequently, patient reports she has had ongoing problems with her lower back and a sensation of numbness in bilateral hips.  Additionally, patient reports that she fell earlier this week, however this was while getting out of a chair and there was no head trauma or head trauma at that time.  Patient reports that she has no symptoms radiating down legs at this time.  Patient has no weakness in her legs.  No saddle anesthesia.  Patient does report that it is painful to walk.  Patient reports that the pain in her lower back kept her up last night.  Patient reports she has been taking a "pain pill" that was prescribed by her primary care provider and it is providing some relief.  Past Medical History:  Diagnosis Date  . Arthritis    pain and oa left knee  . Gastritis    past hx of gastritis -no problem since  . GERD (gastroesophageal reflux disease)     Patient Active Problem List   Diagnosis Date Noted  . Postop Hypokalemia 07/28/2012  . OA (osteoarthritis) of knee 07/25/2012    Past Surgical History:  Procedure Laterality Date  . ESOPHAGOGASTRODUODENOSCOPY ENDOSCOPY    . 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   Procedure: TOTAL KNEE ARTHROPLASTY;  Surgeon: Gearlean Alf, MD;  Location: [facility];  Service: Orthopedics;  Laterality: Left;    OB  History    No data available       Home Medications    Prior to Admission medications   Medication Sig Start Date End Date Taking? Authorizing Provider  ibuprofen (ADVIL,MOTRIN) 400 MG tablet Take 1 tablet (400 mg total) by mouth every 6 (six) hours as needed. Limit use to 2-3 days 08/12/16   Horton, Barbette Hair, MD  levofloxacin (LEVAQUIN) 500 MG tablet Take 500 mg by mouth daily. 10 day course started 06/22/13 06/16/13   [provider]  loratadine (CLARITIN) 10 MG tablet Take 10 mg by mouth daily.    [provider]    Family History No family history on file.  Social History Social History   Tobacco Use  . Smoking status: Never Smoker  . Smokeless tobacco: Never Used  Substance Use Topics  . Alcohol use: No  . Drug use: No     Allergies   Other   Review of Systems Review of Systems  Gastrointestinal: Negative for abdominal pain.  Musculoskeletal: Positive for back pain. Negative for gait problem.  Skin: Negative for wound.  Neurological: Positive for numbness. Negative for weakness.     Physical Exam Updated Vital Signs BP (!) 144/87 (BP Location: Right Arm)   Pulse 60   Temp 98.2 F (36.8 C) (Oral)   Resp 14   Ht 5\' 1"  (1.549 m)   Wt 77.1  kg (170 lb)   SpO2 96%   BMI 32.12 kg/m   Physical Exam  Constitutional: She appears well-developed and well-nourished. No distress.  Sitting comfortably in bed.  HENT:  Head: Normocephalic and atraumatic.  Eyes: Conjunctivae are normal. Right eye exhibits no discharge. Left eye exhibits no discharge.  EOMs normal to gross examination.  Neck: Normal range of motion.  Pulmonary/Chest:  Normal respiratory effort. Patient converses comfortably. No audible wheeze or stridor.  Abdominal: She exhibits no distension.  Musculoskeletal: Normal range of motion.  Patient declined any spinal examination.  Neurological: She is alert.  Cranial nerves intact to gross observation. Patient moves extremities  without difficulty. Gait is symmetric and coordinated with cane.  No evidence of foot drop.  Skin: Skin is warm and dry. She is not diaphoretic.  Psychiatric: She has a normal mood and affect. Her behavior is normal. Judgment and thought content normal.  Nursing note and vitals reviewed.    ED Treatments / Results  Labs (all labs ordered are listed, but only abnormal results are displayed) Labs Reviewed - No data to display  EKG  EKG Interpretation None       Radiology No results found.  Procedures Procedures (including critical care time)  Medications Ordered in ED Medications - No data to display   Initial Impression / Assessment and Plan / ED Course  I have reviewed the triage vital signs and the nursing notes.  Pertinent labs & imaging results that were available during my care of the patient were reviewed by me and considered in my medical decision making (see chart for details).     Final Clinical Impressions(s) / ED Diagnoses   Final diagnoses:  Low back pain, unspecified back pain laterality, unspecified chronicity, with sciatica presence unspecified   Patient and her husband reporting that their purpose of the emergency department visit today was to see a spine specialist.  On uncovering that we would require generalist evaluation initially as well as a general exam, patient and her husband did not wish to proceed with evaluation.  Patient did not wish to receive any spinal exam.  Patient reports she was not dissatisfied with this answer, however she would prefer to do an outpatient workup if she was unable to definitively see a spine specialist here in the emergency department today. Also offered patient to obtain spine x-rays or further imaging if needed so they were prepared for any specialist she may see at a later date, but she and her husband declined this at this time.  Patient was independently seen by Dr. Pattricia Boss and patient had the same response.   Patient and her husband report that she has a physical therapy appointment beginning tomorrow and her orthopedic physician, Dr. Maureen Ralphs suggested that she may be able to have a brief conversation with the spine specialist at that time.  Patient wished to proceed with this route.  Patient and her husband were in full awareness that it was not our recommendation to leave without evaluation of the spine if there was any neurologic symptoms such as numbness.  ED Discharge Orders    None       Tamala Julian 03/14/17 1654    Pattricia Boss, MD 03/14/17 1729

## 2017-03-14 NOTE — ED Triage Notes (Signed)
Pt states she fell 2 months ago in the parking lot, hx of total knee replacement. States she fell on the pavement and hurt her lower back. No loss of bowel or bladder. States she has pain and numbness to lower back and tailbone. Pt also had pain to left arm and left wrist, and upper back.

## 2017-03-14 NOTE — ED Notes (Signed)
Agricultural consultant and PA at the bedside with patient and husband. Pt refusing to put on gown and have a completed assessment.

## 2017-03-14 NOTE — Discharge Instructions (Signed)
Please see the information and instructions below regarding your visit.  Your diagnoses today include:  1. Low back pain, unspecified back pain laterality, unspecified chronicity, with sciatica presence unspecified     Return instructions:  Please return to the Emergency Department if you experience worsening symptoms.  Please return for any fever or chills in the setting of your back pain, weakness in the muscles of the legs, numbness in your legs and feet that is new or changing, numbness in the area where you wipe, retention of your urine, loss of bowel or bladder control, or problems with walking. Please return if you have any other emergent concerns.  Additional Information:  Your vital signs today were: BP (!) 144/87 (BP Location: Right Arm)    Pulse 60    Temp 98.2 F (36.8 C) (Oral)    Resp 14    Ht 5\' 1"  (1.549 m)    Wt 77.1 kg (170 lb)    SpO2 96%    BMI 32.12 kg/m  If your blood pressure (BP) was elevated on multiple readings during this visit above 130 for the top number or above 80 for the bottom number, please have this repeated by your primary care provider within one month. --------------  Thank you for allowing [place] to participate in your care today.

## 2017-04-26 ENCOUNTER — Emergency Department (HOSPITAL_BASED_OUTPATIENT_CLINIC_OR_DEPARTMENT_OTHER)
Admission: EM | Admit: 2017-04-26 | Discharge: 2017-04-27 | Disposition: A | Discharge status: Home / Self Care | Payer: Medicare Other | Attending: Physician Assistant | Admitting: Physician Assistant

## 2017-04-26 ENCOUNTER — Emergency Department (HOSPITAL_BASED_OUTPATIENT_CLINIC_OR_DEPARTMENT_OTHER): Payer: Medicare Other

## 2017-04-26 ENCOUNTER — Encounter (HOSPITAL_BASED_OUTPATIENT_CLINIC_OR_DEPARTMENT_OTHER): Payer: Self-pay | Admitting: Emergency Medicine

## 2017-04-26 DIAGNOSIS — Y9301 Activity, walking, marching and hiking: Secondary | ICD-10-CM | POA: Insufficient documentation

## 2017-04-26 DIAGNOSIS — Y92511 Restaurant or cafe as the place of occurrence of the external cause: Secondary | ICD-10-CM | POA: Diagnosis not present

## 2017-04-26 DIAGNOSIS — Z8781 Personal history of (healed) traumatic fracture: Secondary | ICD-10-CM | POA: Diagnosis not present

## 2017-04-26 DIAGNOSIS — Y999 Unspecified external cause status: Secondary | ICD-10-CM | POA: Insufficient documentation

## 2017-04-26 DIAGNOSIS — W108XXA Fall (on) (from) other stairs and steps, initial encounter: Secondary | ICD-10-CM | POA: Insufficient documentation

## 2017-04-26 DIAGNOSIS — S0081XA Abrasion of other part of head, initial encounter: Secondary | ICD-10-CM | POA: Insufficient documentation

## 2017-04-26 DIAGNOSIS — W19XXXA Unspecified fall, initial encounter: Secondary | ICD-10-CM

## 2017-04-26 DIAGNOSIS — Z79899 Other long term (current) drug therapy: Secondary | ICD-10-CM | POA: Diagnosis not present

## 2017-04-26 DIAGNOSIS — S80211A Abrasion, right knee, initial encounter: Secondary | ICD-10-CM | POA: Diagnosis not present

## 2017-04-26 DIAGNOSIS — Z23 Encounter for immunization: Secondary | ICD-10-CM | POA: Diagnosis not present

## 2017-04-26 DIAGNOSIS — S0990XA Unspecified injury of head, initial encounter: Secondary | ICD-10-CM | POA: Diagnosis present

## 2017-04-26 MED ORDER — TETANUS-DIPHTH-ACELL PERTUSSIS 5-2.5-18.5 LF-MCG/0.5 IM SUSP
0.5000 mL | Freq: Once | INTRAMUSCULAR | Status: AC
  Administered 2017-04-26: 0.5 mL via INTRAMUSCULAR
  Filled 2017-04-26: qty 0.5

## 2017-04-26 MED ORDER — DIPHENHYDRAMINE HCL 12.5 MG/5ML PO ELIX
ORAL_SOLUTION | ORAL | Status: AC
  Administered 2017-04-26: 25 mg
  Filled 2017-04-26: qty 10

## 2017-04-26 MED ORDER — DIPHENHYDRAMINE HCL 25 MG PO CAPS: 25.0000 mg | ORAL_CAPSULE | Freq: Once | ORAL | Status: DC

## 2017-04-26 NOTE — ED Provider Notes (Signed)
[facility] Provider Note   CSN: [id number] Arrival date & time: 04/26/17  1744     History   Chief Complaint Chief Complaint  Patient presents with  . Fall    HPI Natalie Mcfarland is a 77 y.o. female.  HPI   Patient is a 77 year old female presenting after fall.  Patient was walking into a restaurant and fell striking her head on the step.  Patient reports may be loss of consciousness although patient's is a little unclear to understand.  Patient appears well now.  Went to her primary care and sent here for CT.  Past Medical History:  Diagnosis Date  . Arthritis    pain and oa left knee  . Gastritis    past hx of gastritis -no problem since  . GERD (gastroesophageal reflux disease)     Patient Active Problem List   Diagnosis Date Noted  . Postop Hypokalemia 07/28/2012  . OA (osteoarthritis) of knee 07/25/2012    Past Surgical History:  Procedure Laterality Date  . ESOPHAGOGASTRODUODENOSCOPY ENDOSCOPY    . 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 Authorizing Provider  ibuprofen (ADVIL,MOTRIN) 400 MG tablet Take 1 tablet (400 mg total) by mouth every 6 (six) hours as needed. Limit use to 2-3 days 08/12/16   Horton, Barbette Hair, MD  levofloxacin (LEVAQUIN) 500 MG tablet Take 500 mg by mouth daily. 10 day course started 06/22/13 06/16/13   [provider]  loratadine (CLARITIN) 10 MG tablet Take 10 mg by mouth daily.    [provider]    Family History History reviewed. No pertinent family history.  Social History Social History   Tobacco Use  . Smoking status: Never Smoker  . Smokeless tobacco: Never Used  Substance Use Topics  . Alcohol use: No   . Drug use: No     Allergies   Other   Review of Systems Review of Systems  Constitutional: Negative for activity change.  Respiratory: Negative for shortness of breath.   Cardiovascular: Negative for chest pain.  Gastrointestinal: Negative for abdominal pain.     Physical Exam Updated Vital Signs BP (!) 150/82 (BP Location: Left Arm)   Pulse (!) 58   Temp 98 F (36.7 C) (Oral)   Resp 18   Ht 5\' 7"  (1.702 m)   Wt 77.1 kg (170 lb)   SpO2 100%   BMI 26.63 kg/m   Physical Exam  Constitutional: She is oriented to person, place, and time. She appears well-developed and well-nourished.  HENT:  Head: Normocephalic and atraumatic.  Less than 1 cm abraison to the right cheek.  Eyes: Right eye exhibits no discharge. Left eye exhibits no discharge.  Cardiovascular: Normal rate, regular rhythm and normal heart sounds.  No murmur heard. Pulmonary/Chest: Effort normal and breath sounds normal. She has no wheezes. She has no rales.  Abdominal: Soft. She exhibits no distension. There is no tenderness.  Musculoskeletal:  Tenderness on right knee with small bruise and < 4 cm abrasion, no penetrating injury.  Pain to left hand no bruising.  Tenderness to L spine, no bruising or abrasions. .  Neurological: She is oriented to person, place, and  time.  Skin: Skin is warm and dry. She is not diaphoretic.  Psychiatric: She has a normal mood and affect.  Nursing note and vitals reviewed.    ED Treatments / Results  Labs (all labs ordered are listed, but only abnormal results are displayed) Labs Reviewed - No data to display  EKG  EKG Interpretation None       Radiology No results found.  Procedures Procedures (including critical care time)  Medications Ordered in ED Medications  Tdap (BOOSTRIX) injection 0.5 mL (not administered)     Initial Impression / Assessment and Plan / ED Course  I have reviewed the triage vital signs and the nursing notes.  Pertinent  labs & imaging results that were available during my care of the patient were reviewed by me and considered in my medical decision making (see chart for details).     Patient is a 77 year old female presenting after fall.  Patient was walking into a restaurant and fell striking her head on the step.  Patient reports may be loss of consciousness although patient's is a little unclear to understand.  Patient appears well now.  Went to her primary care and sent here for CT.  7:11 PM Appears very well.  Will get CT head and neck.  Will get painful imaging of this area that are bothering her.   10:32 PM X-ray had an odd finding so we ordered CT of the knee.  However there is very minimal swelling a tiny bruise and no evidence of penetrating trauma so unsure what the significance of the CT reading on the knee is.  We will have her follow-up with her primary care physician to discuss.  However there is no signs of infection.  Patient had no pain prior to it.  And there is no evidence of any penetration given the small abrasion and tiny bruise.  There is a small effusion. Patient sees Dr. Binnie Rail, and so we will have her follow up with him.   She has old fracture on her back.  Likely this is from a fall she sustained in the fall of this year.  We will have her discuss this with Dr.Aluisio as well.  After tetanus had two- three hives on arm/neck. Given PO beandryl. Improving.  Dr. Maudie Mercury is her PCP.  Instructions given to call Dr. Maudie Mercury in the morning to have follow-up in the next 2-3 days.  Patient's son is a physician, called him to relay the information.  Patient ambulating with no issue.  Appears very well.  Will instruct her to take another Benadryl prior to sleeping.  If she continues to have skin reaction to take Benadryl every 6-8 hours.   Final Clinical Impressions(s) / ED Diagnoses   Final diagnoses:  None    ED Discharge Orders    None       Arnella Pralle, Fredia Sorrow, MD 04/27/17  0008

## 2017-04-26 NOTE — ED Notes (Signed)
ED Provider at bedside to discuss disposition 

## 2017-04-26 NOTE — ED Notes (Signed)
ED Provider at bedside for re-eval 

## 2017-04-26 NOTE — Discharge Instructions (Signed)
You have no evidence of any fracture in your C-spine, head, face.  The CT of your knee also shows no acute fractures.  The x-ray of your left hand does not show any fractures.  Your x-ray of your lumbar spine shows an old fracture in L1.    We want you to follow-up with Dr. Lavonda Jumbo for both the spinal fracture and a CAT scan read on your right knee.  They can access these results.  We also think you should follow-up with Dr. Maudie Mercury this week just to check and make sure that you are doing okay.   Please use Tylenol for the pain.  You can use the Ace bandages, heat, ice also to help with your symptoms.

## 2017-04-26 NOTE — ED Notes (Signed)
Pt is out of room in radiology at this time.  

## 2017-04-26 NOTE — ED Triage Notes (Signed)
Patient states that she fell onto the right side of her face. The patient tripped and fell. PAtient states that she was knocked out

## 2017-06-03 DIAGNOSIS — Z96652 Presence of left artificial knee joint: Secondary | ICD-10-CM | POA: Insufficient documentation

## 2017-07-15 ENCOUNTER — Other Ambulatory Visit: Payer: Self-pay | Admitting: Rheumatology

## 2019-04-16 ENCOUNTER — Ambulatory Visit: Payer: Medicare PPO | Attending: Internal Medicine

## 2019-04-16 DIAGNOSIS — Z23 Encounter for immunization: Secondary | ICD-10-CM

## 2019-04-16 NOTE — Progress Notes (Signed)
   Covid-19 Vaccination Clinic  Name:  Shakeeta Musick    MRN: [id number] DOB: 1941-01-07  04/16/2019  Ms. Ragain was observed post Covid-19 immunization for 15 minutes without incidence. She was provided with Vaccine Information Sheet and instruction to access the V-Safe system.   Ms. Nash was instructed to call 911 with any severe reactions post vaccine: Marland Kitchen Difficulty breathing  . Swelling of your face and throat  . A fast heartbeat  . A bad rash all over your body  . Dizziness and weakness   Patient was discharged at 1:02.

## 2019-05-04 ENCOUNTER — Ambulatory Visit: Payer: Medicare PPO | Attending: Internal Medicine

## 2019-05-04 DIAGNOSIS — Z23 Encounter for immunization: Secondary | ICD-10-CM

## 2019-05-04 NOTE — Progress Notes (Signed)
   Covid-19 Vaccination Clinic  Name:  Natalie Mcfarland    MRN: [id number] DOB: 1940/06/29  05/04/2019  [name] was observed post Covid-19 immunization for 15 minutes without incidence. She was provided with Vaccine Information Sheet and instruction to access the V-Safe system.   [name] was instructed to call 911 with any severe reactions post vaccine: Marland Kitchen Difficulty breathing  . Swelling of your face and throat  . A fast heartbeat  . A bad rash all over your body  . Dizziness and weakness    Immunizations Administered    Name Date Dose VIS Date Route   Pfizer COVID-19 Vaccine 05/04/2019 12:58 PM 0.3 mL 03/10/2019 Intramuscular   Manufacturer: [place]   Lot: [id number]   [place]: [id number]

## 2019-08-21 DIAGNOSIS — R2689 Other abnormalities of gait and mobility: Secondary | ICD-10-CM | POA: Insufficient documentation

## 2019-08-29 ENCOUNTER — Emergency Department (HOSPITAL_BASED_OUTPATIENT_CLINIC_OR_DEPARTMENT_OTHER): Payer: Medicare PPO

## 2019-08-29 ENCOUNTER — Other Ambulatory Visit: Payer: Self-pay

## 2019-08-29 ENCOUNTER — Encounter (HOSPITAL_BASED_OUTPATIENT_CLINIC_OR_DEPARTMENT_OTHER): Payer: Self-pay | Admitting: *Deleted

## 2019-08-29 ENCOUNTER — Emergency Department (HOSPITAL_BASED_OUTPATIENT_CLINIC_OR_DEPARTMENT_OTHER)
Admission: EM | Admit: 2019-08-29 | Discharge: 2019-08-29 | Disposition: A | Discharge status: Home / Self Care | Payer: Medicare PPO | Attending: Emergency Medicine | Admitting: Emergency Medicine

## 2019-08-29 DIAGNOSIS — M159 Polyosteoarthritis, unspecified: Secondary | ICD-10-CM | POA: Diagnosis not present

## 2019-08-29 DIAGNOSIS — Z79899 Other long term (current) drug therapy: Secondary | ICD-10-CM | POA: Insufficient documentation

## 2019-08-29 DIAGNOSIS — M130 Polyarthritis, unspecified: Secondary | ICD-10-CM

## 2019-08-29 DIAGNOSIS — M25511 Pain in right shoulder: Secondary | ICD-10-CM | POA: Insufficient documentation

## 2019-08-29 DIAGNOSIS — M542 Cervicalgia: Secondary | ICD-10-CM | POA: Diagnosis present

## 2019-08-29 DIAGNOSIS — R519 Headache, unspecified: Secondary | ICD-10-CM | POA: Diagnosis not present

## 2019-08-29 DIAGNOSIS — M25512 Pain in left shoulder: Secondary | ICD-10-CM | POA: Insufficient documentation

## 2019-08-29 MED ORDER — OXYCODONE-ACETAMINOPHEN 5-325 MG PO TABS
1.0000 | ORAL_TABLET | Freq: Once | ORAL | Status: AC
  Administered 2019-08-29: 1 via ORAL
  Filled 2019-08-29: qty 1

## 2019-08-29 MED ORDER — GABAPENTIN 600 MG PO TABS
300.0000 mg | ORAL_TABLET | Freq: Once | ORAL | Status: DC
  Filled 2019-08-29: qty 0.5

## 2019-08-29 MED ORDER — DEXAMETHASONE 6 MG PO TABS
12.0000 mg | ORAL_TABLET | Freq: Once | ORAL | Status: AC
  Administered 2019-08-29: 12 mg via ORAL
  Filled 2019-08-29: qty 2

## 2019-08-29 MED ORDER — GABAPENTIN 300 MG PO CAPS
ORAL_CAPSULE | ORAL | Status: AC
  Administered 2019-08-29: 300 mg
  Filled 2019-08-29: qty 1

## 2019-08-29 MED ORDER — DEXAMETHASONE 4 MG PO TABS
4.0000 mg | ORAL_TABLET | Freq: Two times a day (BID) | ORAL | 0 refills | Status: DC
Start: 2019-08-29 — End: 2021-10-21

## 2019-08-29 NOTE — ED Triage Notes (Addendum)
She fell 2 weeks ago when her left knee gave out. Hx of knee replacement years ago.  Injury to her back, neck and head. She is ambulatory. She had acupuncture with relief.

## 2019-08-29 NOTE — ED Notes (Signed)
Pt continuously calling out requesting "separate XR of the neck" in addition to what she has already had. I reassured pt that she had a CT scan of her neck which is far more detailed and we are awaiting results. Pt then requesting XR of other parts of her body. Pt again reassured that the Dr. Wilson Singer has fully evaluated her and ordered the appropriate images. EDP notified of pt's request. See new order for hand XR.

## 2019-08-29 NOTE — ED Provider Notes (Signed)
[facility] DE[facility] Provider Note   CSN: [id number] Arrival date & time: 08/29/19  1432     History Chief Complaint  Patient presents with  . Fall  . Back Pain    Natalie Mcfarland is a 79 y.o. female.  HPI   79 year old female presenting after fall.  She fell approximately 2 weeks ago.  She states that her left knee gave out and she fell backwards.  Since that time she has been having pain in her neck, posterior head and shoulders bilaterally.  Afterward she had acupuncture with some mild transient relief.  She has also been taking Tylenol and ibuprofen but has not improved all that much.  She has pain at rest.  Worse with movement such as sitting up going from a seated or laying position.  No acute numbness, tingling or focal loss of strength.  She is not anticoagulated.  Past Medical History:  Diagnosis Date  . Arthritis    pain and oa left knee  . Gastritis    past hx of gastritis -no problem since  . GERD (gastroesophageal reflux disease)     Patient Active Problem List   Diagnosis Date Noted  . Postop Hypokalemia 07/28/2012  . OA (osteoarthritis) of knee 07/25/2012    Past Surgical History:  Procedure Laterality Date  . ESOPHAGOGASTRODUODENOSCOPY ENDOSCOPY    . 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     No family history on file.  Social History   Tobacco Use  . Smoking status: Never Smoker  . Smokeless tobacco: Never Used  Substance Use Topics  . Alcohol use: No  . Drug use: No    Home Medications Prior to Admission medications   Medication Sig Start Date End Date Taking? Authorizing Provider  ibuprofen (ADVIL,MOTRIN) 400 MG tablet Take 1 tablet (400 mg total) by mouth every 6 (six) hours as needed. Limit use to 2-3 days 08/12/16   Horton, Barbette Hair, MD    levofloxacin (LEVAQUIN) 500 MG tablet Take 500 mg by mouth daily. 10 day course started 06/22/13 06/16/13   [provider]  loratadine (CLARITIN) 10 MG tablet Take 10 mg by mouth daily.    [provider]    Allergies    Other  Review of Systems   Review of Systems All systems reviewed and negative, other than as noted in HPI.  Physical Exam Updated Vital Signs BP (!) 150/95   Pulse 68   Temp 98.3 F (36.8 C) (Oral)   Resp 18   Ht 5\' 7"  (1.702 m)   Wt 77.1 kg   SpO2 95%   BMI 26.63 kg/m   Physical Exam Vitals and nursing note reviewed.  Constitutional:      General: She is not in acute distress.    Appearance: She is well-developed.  HENT:     Head: Normocephalic and atraumatic.  Eyes:     General:        Right eye: No discharge.        Left eye: No discharge.     Conjunctiva/sclera: Conjunctivae normal.  Cardiovascular:     Rate and Rhythm: Normal rate and regular rhythm.     Heart sounds: Normal heart sounds. No murmur. No friction rub. No gallop.   Pulmonary:     Effort: Pulmonary  effort is normal. No respiratory distress.     Breath sounds: Normal breath sounds.  Abdominal:     General: There is no distension.     Palpations: Abdomen is soft.     Tenderness: There is no abdominal tenderness.  Musculoskeletal:        General: No tenderness.     Cervical back: Neck supple.     Comments: Mild tenderness in the midline and paraspinally in the mid to upper cervical spine.  No midline spinal tenderness elsewhere.  Some paraspinal tenderness in the lumbar region.  Strength is normal in upper extremities.  She is able to stand up unassisted from a seated position.  Skin:    General: Skin is warm and dry.  Neurological:     Mental Status: She is alert.     Cranial Nerves: No cranial nerve deficit.     Motor: No weakness.     Coordination: Coordination normal.  Psychiatric:        Behavior: Behavior normal.        Thought Content: Thought  content normal.     ED Results / Procedures / Treatments   Labs (all labs ordered are listed, but only abnormal results are displayed) Labs Reviewed - No data to display  EKG None  Radiology DG Lumbar Spine Complete  Result Date: 08/29/2019 CLINICAL DATA:  [place] 3 weeks ago with low back pain. EXAM: LUMBAR SPINE - COMPLETE 4+ VIEW COMPARISON:  04/26/2017 FINDINGS: There has been interval healing of an inferior endplate fracture at L1 which was probably in the process of healing in January of 2019. No evidence of new regional fracture. There is disc space narrowing in the lower lumbar region. 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 IMPRESSION: No acute finding. Old healed inferior endplate fracture at L1. 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 Lumbar region disc space narrowing. Electronically Signed   By: Nelson Chimes M.D.   On: 08/29/2019 16:05   CT Head Wo Contrast  Result Date: 08/29/2019 CLINICAL DATA:  Headache, posttraumatic. Neck trauma. Additional provided: Fall in garden 3 weeks ago, pain in head, neck, back. EXAM: CT HEAD WITHOUT CONTRAST CT CERVICAL SPINE WITHOUT CONTRAST TECHNIQUE: Multidetector CT imaging of the head and cervical spine was performed following the standard protocol without intravenous contrast. Multiplanar CT image reconstructions of the cervical spine were also generated. COMPARISON:  CT head/cervical spine/maxillofacial 04/26/2017 FINDINGS: CT HEAD FINDINGS Brain: Stable, mild generalized parenchymal atrophy. There is no acute intracranial hemorrhage. No demarcated cortical infarct. No extra-axial fluid collection. No evidence of intracranial mass. No midline shift. Vascular: No hyperdense vessel.  Atherosclerotic calcifications. Skull: Normal. Negative for fracture or focal lesion. Sinuses/Orbits: Visualized orbits show no acute finding. Mild right maxillary sinus mucosal thickening. No significant mastoid  effusion. CT CERVICAL SPINE FINDINGS Alignment: Straightening of the expected cervical lordosis. Trace C3-C4 and C4-C5 anterolisthesis. Skull base and vertebrae: The basion-dental and atlanto-dental intervals are maintained.No evidence of acute fracture to the cervical spine. Soft tissues and spinal canal: No prevertebral fluid or swelling. No visible canal hematoma. Disc levels: Cervical spondylosis. Most notably at C5-C6 there is moderate/advanced disc space narrowing with a posterior disc osteophyte complex and uncovertebral hypertrophy. Multilevel facet arthrosis. Upper chest: No consolidation within the imaged lung apices. No visible pneumothorax. IMPRESSION: CT head: 1. No evidence of acute intracranial abnormality. 2. Stable, mild generalized parenchymal atrophy. 3. Mild right maxillary sinus mucosal thickening. CT cervical spine:  1. No evidence of acute fracture to the cervical spine. 2. 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 3. Trace C3-C4 and C4-C5 anterolisthesis. Electronically Signed   By: Kellie Simmering DO   On: 08/29/2019 16:16   CT Cervical Spine Wo Contrast  Result Date: 08/29/2019 CLINICAL DATA:  Headache, posttraumatic. Neck trauma. Additional provided: Fall in garden 3 weeks ago, pain in head, neck, back. EXAM: CT HEAD WITHOUT CONTRAST CT CERVICAL SPINE WITHOUT CONTRAST TECHNIQUE: Multidetector CT imaging of the head and cervical spine was performed following the standard protocol without intravenous contrast. Multiplanar CT image reconstructions of the cervical spine were also generated. COMPARISON:  CT head/cervical spine/maxillofacial 04/26/2017 FINDINGS: CT HEAD FINDINGS Brain: Stable, mild generalized parenchymal atrophy. There is no acute intracranial hemorrhage. No demarcated cortical infarct. No extra-axial fluid collection. No evidence of intracranial mass. No midline shift. Vascular: No hyperdense vessel.  Atherosclerotic calcifications. Skull: Normal. Negative for fracture or focal  lesion. Sinuses/Orbits: Visualized orbits show no acute finding. Mild right maxillary sinus mucosal thickening. No significant mastoid effusion. CT CERVICAL SPINE FINDINGS Alignment: Straightening of the expected cervical lordosis. Trace C3-C4 and C4-C5 anterolisthesis. Skull base and vertebrae: The basion-dental and atlanto-dental intervals are maintained.No evidence of acute fracture to the cervical spine. Soft tissues and spinal canal: No prevertebral fluid or swelling. No visible canal hematoma. Disc levels: Cervical spondylosis. Most notably at C5-C6 there is moderate/advanced disc space narrowing with a posterior disc osteophyte complex and uncovertebral hypertrophy. Multilevel facet arthrosis. Upper chest: No consolidation within the imaged lung apices. No visible pneumothorax. IMPRESSION: CT head: 1. No evidence of acute intracranial abnormality. 2. Stable, mild generalized parenchymal atrophy. 3. Mild right maxillary sinus mucosal thickening. CT cervical spine: 1. No evidence of acute fracture to the cervical spine. 2. 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 3. Trace C3-C4 and C4-C5 anterolisthesis. Electronically Signed   By: Kellie Simmering DO   On: 08/29/2019 16:16   DG Hand Complete Left  Result Date: 08/29/2019 CLINICAL DATA:  Fall.  Left hand pain. EXAM: LEFT HAND - COMPLETE 3+ VIEW COMPARISON:  None. FINDINGS: No fracture or dislocation. No suspicious focal osseous lesions. Mild interphalangeal joint left thumb and first carpometacarpal joint osteoarthritis. No radiopaque foreign bodies. IMPRESSION: No fracture or dislocation. Mild polyarticular osteoarthritis. Electronically Signed   By: Ilona Sorrel M.D.   On: 08/29/2019 17:06    Procedures Procedures (including critical care time)  Medications Ordered in ED Medications  dexamethasone (DECADRON) tablet 12 mg (has no administration in time range)  oxyCODONE-acetaminophen (PERCOCET/ROXICET) 5-325 MG per tablet 1 tablet (has no  administration in time range)  gabapentin (NEURONTIN) tablet 300 mg (has no administration in time range)  gabapentin (NEURONTIN) 300 MG capsule (has no administration in time range)    ED Course  I have reviewed the triage vital signs and the nursing notes.  Pertinent labs & imaging results that were available during my care of the patient were reviewed by me and considered in my medical decision making (see chart for details).    MDM Rules/Calculators/A&P                      79 year old female with headache, neck, shoulder and back pain after mechanical fall about 2 weeks ago.  This is likely musculoskeletal pain.  She only has mild tenderness in the midline.  Neuro exam is nonfocal.  Given the duration of her symptoms 2 weeks after initial injury with still significant pain, I feel that imaging is reasonable.  She has numerous requests for imaging of other areas including elbows, "waist", etc. No indication for acute imaging of these based on exam nor history though. Will need to follow-up with her PCP or orthopedist. Symptomatic treatment otherwise.  We will obviously address any acute appearing abnormalities on imaging.  Otherwise plan continue symptomatic treatment outpatient follow-up.    Final Clinical Impression(s) / ED Diagnoses Final diagnoses:  Polyarthritis    Rx / DC Orders ED Discharge Orders    None       Virgel Manifold, MD 08/31/19 1054

## 2019-10-12 ENCOUNTER — Other Ambulatory Visit: Payer: Self-pay | Admitting: Family Medicine

## 2019-10-12 ENCOUNTER — Ambulatory Visit
Admission: RE | Admit: 2019-10-12 | Discharge: 2019-10-12 | Disposition: A | Discharge status: Home / Self Care | Payer: Medicare PPO | Source: Ambulatory Visit | Attending: Family Medicine | Admitting: Family Medicine

## 2019-10-12 DIAGNOSIS — R1031 Right lower quadrant pain: Secondary | ICD-10-CM

## 2019-10-12 MED ORDER — IOPAMIDOL (ISOVUE-300) INJECTION 61%
100.0000 mL | Freq: Once | INTRAVENOUS | Status: AC | PRN
  Administered 2019-10-12: 100 mL via INTRAVENOUS

## 2020-01-03 DIAGNOSIS — H524 Presbyopia: Secondary | ICD-10-CM | POA: Diagnosis not present

## 2020-01-03 DIAGNOSIS — H02834 Dermatochalasis of left upper eyelid: Secondary | ICD-10-CM | POA: Diagnosis not present

## 2020-01-03 DIAGNOSIS — H04123 Dry eye syndrome of bilateral lacrimal glands: Secondary | ICD-10-CM | POA: Diagnosis not present

## 2020-01-03 DIAGNOSIS — H02831 Dermatochalasis of right upper eyelid: Secondary | ICD-10-CM | POA: Diagnosis not present

## 2020-01-09 DIAGNOSIS — Z23 Encounter for immunization: Secondary | ICD-10-CM | POA: Diagnosis not present

## 2020-02-12 DIAGNOSIS — Z1211 Encounter for screening for malignant neoplasm of colon: Secondary | ICD-10-CM | POA: Diagnosis not present

## 2020-02-12 DIAGNOSIS — C884 Extranodal marginal zone B-cell lymphoma of mucosa-associated lymphoid tissue [MALT-lymphoma]: Secondary | ICD-10-CM | POA: Diagnosis not present

## 2020-02-12 DIAGNOSIS — R0781 Pleurodynia: Secondary | ICD-10-CM | POA: Diagnosis not present

## 2020-03-05 DIAGNOSIS — H02831 Dermatochalasis of right upper eyelid: Secondary | ICD-10-CM | POA: Diagnosis not present

## 2020-03-05 DIAGNOSIS — H02834 Dermatochalasis of left upper eyelid: Secondary | ICD-10-CM | POA: Diagnosis not present

## 2020-03-05 DIAGNOSIS — H04123 Dry eye syndrome of bilateral lacrimal glands: Secondary | ICD-10-CM | POA: Diagnosis not present

## 2020-07-16 DIAGNOSIS — M542 Cervicalgia: Secondary | ICD-10-CM | POA: Insufficient documentation

## 2020-07-16 DIAGNOSIS — H9202 Otalgia, left ear: Secondary | ICD-10-CM | POA: Diagnosis not present

## 2020-07-16 DIAGNOSIS — L299 Pruritus, unspecified: Secondary | ICD-10-CM | POA: Diagnosis not present

## 2020-07-16 DIAGNOSIS — M26609 Unspecified temporomandibular joint disorder, unspecified side: Secondary | ICD-10-CM | POA: Diagnosis not present

## 2020-12-03 DIAGNOSIS — H524 Presbyopia: Secondary | ICD-10-CM | POA: Diagnosis not present

## 2020-12-03 DIAGNOSIS — H0100A Unspecified blepharitis right eye, upper and lower eyelids: Secondary | ICD-10-CM | POA: Diagnosis not present

## 2020-12-03 DIAGNOSIS — H35373 Puckering of macula, bilateral: Secondary | ICD-10-CM | POA: Diagnosis not present

## 2020-12-03 DIAGNOSIS — H04123 Dry eye syndrome of bilateral lacrimal glands: Secondary | ICD-10-CM | POA: Diagnosis not present

## 2020-12-30 DIAGNOSIS — N760 Acute vaginitis: Secondary | ICD-10-CM | POA: Diagnosis not present

## 2020-12-30 DIAGNOSIS — L293 Anogenital pruritus, unspecified: Secondary | ICD-10-CM | POA: Diagnosis not present

## 2020-12-30 DIAGNOSIS — R300 Dysuria: Secondary | ICD-10-CM | POA: Diagnosis not present

## 2020-12-30 DIAGNOSIS — R102 Pelvic and perineal pain: Secondary | ICD-10-CM | POA: Diagnosis not present

## 2021-01-06 DIAGNOSIS — R1013 Epigastric pain: Secondary | ICD-10-CM | POA: Diagnosis not present

## 2021-01-06 DIAGNOSIS — Z1211 Encounter for screening for malignant neoplasm of colon: Secondary | ICD-10-CM | POA: Diagnosis not present

## 2021-01-06 DIAGNOSIS — C884 Extranodal marginal zone B-cell lymphoma of mucosa-associated lymphoid tissue [MALT-lymphoma]: Secondary | ICD-10-CM | POA: Diagnosis not present

## 2021-01-16 DIAGNOSIS — K2970 Gastritis, unspecified, without bleeding: Secondary | ICD-10-CM | POA: Diagnosis not present

## 2021-01-16 DIAGNOSIS — R1013 Epigastric pain: Secondary | ICD-10-CM | POA: Diagnosis not present

## 2021-01-16 DIAGNOSIS — K31A11 Gastric intestinal metaplasia without dysplasia, involving the antrum: Secondary | ICD-10-CM | POA: Diagnosis not present

## 2021-01-16 DIAGNOSIS — K2950 Unspecified chronic gastritis without bleeding: Secondary | ICD-10-CM | POA: Diagnosis not present

## 2021-01-16 DIAGNOSIS — K3189 Other diseases of stomach and duodenum: Secondary | ICD-10-CM | POA: Diagnosis not present

## 2021-01-16 DIAGNOSIS — K2940 Chronic atrophic gastritis without bleeding: Secondary | ICD-10-CM | POA: Diagnosis not present

## 2021-01-16 DIAGNOSIS — C884 Extranodal marginal zone B-cell lymphoma of mucosa-associated lymphoid tissue [MALT-lymphoma]: Secondary | ICD-10-CM | POA: Diagnosis not present

## 2021-01-29 DIAGNOSIS — Z20822 Contact with and (suspected) exposure to covid-19: Secondary | ICD-10-CM | POA: Diagnosis not present

## 2021-08-07 DIAGNOSIS — Z01419 Encounter for gynecological examination (general) (routine) without abnormal findings: Secondary | ICD-10-CM | POA: Diagnosis not present

## 2021-08-07 DIAGNOSIS — Z6831 Body mass index (BMI) 31.0-31.9, adult: Secondary | ICD-10-CM | POA: Diagnosis not present

## 2021-08-07 DIAGNOSIS — N952 Postmenopausal atrophic vaginitis: Secondary | ICD-10-CM | POA: Diagnosis not present

## 2021-08-08 ENCOUNTER — Other Ambulatory Visit: Payer: Self-pay | Admitting: Physician Assistant

## 2021-08-08 DIAGNOSIS — M545 Low back pain, unspecified: Secondary | ICD-10-CM

## 2021-08-08 DIAGNOSIS — M546 Pain in thoracic spine: Secondary | ICD-10-CM | POA: Diagnosis not present

## 2021-08-11 ENCOUNTER — Other Ambulatory Visit: Payer: Self-pay

## 2021-08-11 ENCOUNTER — Other Ambulatory Visit: Payer: Self-pay | Admitting: Physician Assistant

## 2021-08-11 DIAGNOSIS — N958 Other specified menopausal and perimenopausal disorders: Secondary | ICD-10-CM | POA: Diagnosis not present

## 2021-08-11 DIAGNOSIS — M8588 Other specified disorders of bone density and structure, other site: Secondary | ICD-10-CM | POA: Diagnosis not present

## 2021-08-11 DIAGNOSIS — Z1231 Encounter for screening mammogram for malignant neoplasm of breast: Secondary | ICD-10-CM | POA: Diagnosis not present

## 2021-08-11 DIAGNOSIS — M545 Low back pain, unspecified: Secondary | ICD-10-CM

## 2021-08-12 ENCOUNTER — Ambulatory Visit (HOSPITAL_COMMUNITY): Admission: RE | Admit: 2021-08-12 | Payer: Medicare PPO | Source: Ambulatory Visit

## 2021-08-12 ENCOUNTER — Ambulatory Visit (HOSPITAL_COMMUNITY): Payer: Medicare PPO

## 2021-08-13 ENCOUNTER — Other Ambulatory Visit: Payer: Self-pay | Admitting: Physician Assistant

## 2021-08-13 ENCOUNTER — Other Ambulatory Visit (HOSPITAL_COMMUNITY): Payer: Self-pay | Admitting: Physician Assistant

## 2021-08-13 DIAGNOSIS — M545 Low back pain, unspecified: Secondary | ICD-10-CM

## 2021-08-14 ENCOUNTER — Ambulatory Visit (HOSPITAL_BASED_OUTPATIENT_CLINIC_OR_DEPARTMENT_OTHER)
Admission: RE | Admit: 2021-08-14 | Discharge: 2021-08-14 | Disposition: A | Discharge status: Home / Self Care | Payer: Medicare PPO | Source: Ambulatory Visit | Attending: Physician Assistant | Admitting: Physician Assistant

## 2021-08-14 DIAGNOSIS — M47894 Other spondylosis, thoracic region: Secondary | ICD-10-CM | POA: Diagnosis not present

## 2021-08-14 DIAGNOSIS — M4804 Spinal stenosis, thoracic region: Secondary | ICD-10-CM | POA: Diagnosis not present

## 2021-08-14 DIAGNOSIS — M5136 Other intervertebral disc degeneration, lumbar region: Secondary | ICD-10-CM | POA: Insufficient documentation

## 2021-08-14 DIAGNOSIS — W19XXXA Unspecified fall, initial encounter: Secondary | ICD-10-CM | POA: Diagnosis not present

## 2021-08-14 DIAGNOSIS — M47814 Spondylosis without myelopathy or radiculopathy, thoracic region: Secondary | ICD-10-CM | POA: Diagnosis not present

## 2021-08-14 DIAGNOSIS — M545 Low back pain, unspecified: Secondary | ICD-10-CM | POA: Insufficient documentation

## 2021-08-14 DIAGNOSIS — M48061 Spinal stenosis, lumbar region without neurogenic claudication: Secondary | ICD-10-CM | POA: Diagnosis not present

## 2021-08-14 DIAGNOSIS — Z043 Encounter for examination and observation following other accident: Secondary | ICD-10-CM | POA: Diagnosis not present

## 2021-08-14 DIAGNOSIS — M5124 Other intervertebral disc displacement, thoracic region: Secondary | ICD-10-CM | POA: Diagnosis not present

## 2021-08-17 ENCOUNTER — Other Ambulatory Visit: Payer: Medicare PPO

## 2021-08-19 DIAGNOSIS — M545 Low back pain, unspecified: Secondary | ICD-10-CM | POA: Diagnosis not present

## 2021-08-28 DIAGNOSIS — M810 Age-related osteoporosis without current pathological fracture: Secondary | ICD-10-CM | POA: Diagnosis not present

## 2021-08-28 DIAGNOSIS — M179 Osteoarthritis of knee, unspecified: Secondary | ICD-10-CM | POA: Diagnosis not present

## 2021-08-28 DIAGNOSIS — Z8719 Personal history of other diseases of the digestive system: Secondary | ICD-10-CM | POA: Diagnosis not present

## 2021-08-28 DIAGNOSIS — Z1389 Encounter for screening for other disorder: Secondary | ICD-10-CM | POA: Diagnosis not present

## 2021-08-28 DIAGNOSIS — R7989 Other specified abnormal findings of blood chemistry: Secondary | ICD-10-CM | POA: Diagnosis not present

## 2021-08-28 DIAGNOSIS — R413 Other amnesia: Secondary | ICD-10-CM | POA: Diagnosis not present

## 2021-08-28 DIAGNOSIS — M8088XD Other osteoporosis with current pathological fracture, vertebra(e), subsequent encounter for fracture with routine healing: Secondary | ICD-10-CM | POA: Diagnosis not present

## 2021-08-28 DIAGNOSIS — Z1331 Encounter for screening for depression: Secondary | ICD-10-CM | POA: Diagnosis not present

## 2021-08-28 DIAGNOSIS — E559 Vitamin D deficiency, unspecified: Secondary | ICD-10-CM | POA: Diagnosis not present

## 2021-08-28 DIAGNOSIS — J4520 Mild intermittent asthma, uncomplicated: Secondary | ICD-10-CM | POA: Diagnosis not present

## 2021-08-28 DIAGNOSIS — Z0000 Encounter for general adult medical examination without abnormal findings: Secondary | ICD-10-CM | POA: Diagnosis not present

## 2021-09-04 DIAGNOSIS — M5416 Radiculopathy, lumbar region: Secondary | ICD-10-CM | POA: Diagnosis not present

## 2021-09-05 DIAGNOSIS — Z96652 Presence of left artificial knee joint: Secondary | ICD-10-CM | POA: Diagnosis not present

## 2021-09-22 ENCOUNTER — Other Ambulatory Visit: Payer: Self-pay

## 2021-09-22 DIAGNOSIS — R55 Syncope and collapse: Secondary | ICD-10-CM | POA: Diagnosis not present

## 2021-09-22 DIAGNOSIS — E538 Deficiency of other specified B group vitamins: Secondary | ICD-10-CM

## 2021-09-23 ENCOUNTER — Ambulatory Visit: Payer: Medicare PPO | Admitting: Family Medicine

## 2021-09-23 DIAGNOSIS — L309 Dermatitis, unspecified: Secondary | ICD-10-CM | POA: Insufficient documentation

## 2021-09-23 DIAGNOSIS — E559 Vitamin D deficiency, unspecified: Secondary | ICD-10-CM | POA: Insufficient documentation

## 2021-09-23 DIAGNOSIS — M810 Age-related osteoporosis without current pathological fracture: Secondary | ICD-10-CM | POA: Insufficient documentation

## 2021-09-23 DIAGNOSIS — K219 Gastro-esophageal reflux disease without esophagitis: Secondary | ICD-10-CM | POA: Insufficient documentation

## 2021-09-23 DIAGNOSIS — J45909 Unspecified asthma, uncomplicated: Secondary | ICD-10-CM | POA: Insufficient documentation

## 2021-09-23 DIAGNOSIS — H04129 Dry eye syndrome of unspecified lacrimal gland: Secondary | ICD-10-CM | POA: Insufficient documentation

## 2021-09-24 DIAGNOSIS — M5451 Vertebrogenic low back pain: Secondary | ICD-10-CM | POA: Diagnosis not present

## 2021-09-27 LAB — CBC WITH DIFFERENTIAL/PLATELET
Basophils Absolute: 0 10*3/uL (ref 0.0–0.2)
Basos: 0 %
EOS (ABSOLUTE): 0.1 10*3/uL (ref 0.0–0.4)
Eos: 2 %
Hematocrit: 37.3 % (ref 34.0–46.6)
Hemoglobin: 12.6 g/dL (ref 11.1–15.9)
Immature Grans (Abs): 0 10*3/uL (ref 0.0–0.1)
Immature Granulocytes: 0 %
Lymphocytes Absolute: 1.1 10*3/uL (ref 0.7–3.1)
Lymphs: 29 %
MCH: 32.9 pg (ref 26.6–33.0)
MCHC: 33.8 g/dL (ref 31.5–35.7)
MCV: 97 fL (ref 79–97)
Monocytes Absolute: 0.4 10*3/uL (ref 0.1–0.9)
Monocytes: 9 %
Neutrophils Absolute: 2.4 10*3/uL (ref 1.4–7.0)
Neutrophils: 60 %
Platelets: 204 10*3/uL (ref 150–450)
RBC: 3.83 x10E6/uL (ref 3.77–5.28)
RDW: 12.2 % (ref 11.7–15.4)
WBC: 3.9 10*3/uL (ref 3.4–10.8)

## 2021-09-27 LAB — COMPREHENSIVE METABOLIC PANEL
ALT: 17 IU/L (ref 0–32)
AST: 15 IU/L (ref 0–40)
Albumin/Globulin Ratio: 1.4 (ref 1.2–2.2)
Albumin: 4.3 g/dL (ref 3.6–4.6)
Alkaline Phosphatase: 50 IU/L (ref 44–121)
BUN/Creatinine Ratio: 30 — ABNORMAL HIGH (ref 12–28)
BUN: 15 mg/dL (ref 8–27)
Bilirubin Total: 0.4 mg/dL (ref 0.0–1.2)
CO2: 22 mmol/L (ref 20–29)
Calcium: 9.3 mg/dL (ref 8.7–10.3)
Chloride: 105 mmol/L (ref 96–106)
Creatinine, Ser: 0.5 mg/dL — ABNORMAL LOW (ref 0.57–1.00)
Globulin, Total: 3 g/dL (ref 1.5–4.5)
Glucose: 105 mg/dL — ABNORMAL HIGH (ref 70–99)
Potassium: 4.3 mmol/L (ref 3.5–5.2)
Sodium: 141 mmol/L (ref 134–144)
Total Protein: 7.3 g/dL (ref 6.0–8.5)
eGFR: 94 mL/min/{1.73_m2} (ref >59)

## 2021-09-27 LAB — B12 AND FOLATE PANEL
Folate: >20 ng/mL (ref >3.0)
Vitamin B-12: >2000 pg/mL — ABNORMAL HIGH (ref 232–1245)

## 2021-09-27 LAB — LACTATE DEHYDROGENASE: LDH: 201 IU/L (ref 119–226)

## 2021-09-27 LAB — INTRINSIC FACTOR ANTIBODIES: Intrinsic Factor Abs, Serum: 44 AU/mL — ABNORMAL HIGH (ref 0.0–1.1)

## 2021-09-27 LAB — HOMOCYSTEINE: Homocysteine: 9.4 umol/L (ref 0.0–21.3)

## 2021-09-27 LAB — HAPTOGLOBIN: Haptoglobin: 112 mg/dL (ref 41–333)

## 2021-09-27 LAB — METHYLMALONIC ACID, SERUM: Methylmalonic Acid: 136 nmol/L (ref 0–378)

## 2021-10-07 ENCOUNTER — Telehealth (INDEPENDENT_AMBULATORY_CARE_PROVIDER_SITE_OTHER): Payer: Self-pay

## 2021-10-07 DIAGNOSIS — R051 Acute cough: Secondary | ICD-10-CM

## 2021-10-07 MED ORDER — BENZONATATE 200 MG PO CAPS: 200.0000 mg | ORAL_CAPSULE | Freq: Two times a day (BID) | ORAL | 0 refills | Status: DC | PRN

## 2021-10-07 MED ORDER — HYDROCODONE BIT-HOMATROP MBR 5-1.5 MG/5ML PO SOLN: 5.0000 mL | Freq: Three times a day (TID) | ORAL | 0 refills | Status: DC | PRN

## 2021-10-07 NOTE — Telephone Encounter (Signed)
Sent cough meds. Pt had recent visitors. Possible flu/covid/rsv exposure. Low threshold to swab pt.  Elwin Mocha, MD

## 2021-10-16 ENCOUNTER — Other Ambulatory Visit: Payer: Self-pay | Admitting: Family Medicine

## 2021-10-16 DIAGNOSIS — R058 Other specified cough: Secondary | ICD-10-CM

## 2021-10-16 MED ORDER — ALBUTEROL SULFATE HFA 108 (90 BASE) MCG/ACT IN AERS: 2.0000 | INHALATION_SPRAY | Freq: Four times a day (QID) | RESPIRATORY_TRACT | 0 refills | Status: DC | PRN

## 2021-10-16 MED ORDER — DOXYCYCLINE HYCLATE 100 MG PO TABS: 100.0000 mg | ORAL_TABLET | Freq: Two times a day (BID) | ORAL | 0 refills | Status: AC

## 2021-10-16 NOTE — Telephone Encounter (Signed)
Pt has productive cough req sabx. Sent abx and inhaler.  Elwin Mocha, MD

## 2021-10-20 ENCOUNTER — Other Ambulatory Visit: Payer: Self-pay | Admitting: Family Medicine

## 2021-10-20 DIAGNOSIS — R058 Other specified cough: Secondary | ICD-10-CM

## 2021-10-21 ENCOUNTER — Other Ambulatory Visit: Payer: Self-pay | Admitting: Family Medicine

## 2021-10-21 MED ORDER — GUAIFENESIN ER 600 MG PO TB12: 600.0000 mg | ORAL_TABLET | Freq: Two times a day (BID) | ORAL | 0 refills | Status: DC

## 2021-10-21 MED ORDER — DEXAMETHASONE 2 MG PO TABS: 2.0000 mg | ORAL_TABLET | Freq: Every day | ORAL | 0 refills | Status: AC

## 2021-10-21 NOTE — Telephone Encounter (Signed)
Medication request. For dexamethasone for cough. No SOB.  Sent to primary pharmacy.  Elwin Mocha, MD

## 2021-10-24 ENCOUNTER — Other Ambulatory Visit: Payer: Self-pay | Admitting: Family Medicine

## 2021-10-24 DIAGNOSIS — R052 Subacute cough: Secondary | ICD-10-CM

## 2021-10-24 MED ORDER — ALBUTEROL SULFATE HFA 108 (90 BASE) MCG/ACT IN AERS: 2.0000 | INHALATION_SPRAY | Freq: Four times a day (QID) | RESPIRATORY_TRACT | 0 refills | Status: DC | PRN

## 2021-10-24 NOTE — Telephone Encounter (Signed)
Refilled albuterol. Pt still with cough. Pt to get mucinex otc.  Elwin Mocha, MD

## 2021-11-25 ENCOUNTER — Other Ambulatory Visit: Payer: Self-pay

## 2021-11-25 DIAGNOSIS — E538 Deficiency of other specified B group vitamins: Secondary | ICD-10-CM

## 2021-11-25 DIAGNOSIS — E539 Vitamin B deficiency, unspecified: Secondary | ICD-10-CM | POA: Diagnosis not present

## 2021-11-25 DIAGNOSIS — D510 Vitamin B12 deficiency anemia due to intrinsic factor deficiency: Secondary | ICD-10-CM | POA: Diagnosis not present

## 2021-11-26 LAB — B12 AND FOLATE PANEL
Folate: 14 ng/mL (ref >3.0)
Vitamin B-12: 770 pg/mL (ref 232–1245)

## 2021-12-09 DIAGNOSIS — H5213 Myopia, bilateral: Secondary | ICD-10-CM | POA: Diagnosis not present

## 2021-12-09 DIAGNOSIS — H04123 Dry eye syndrome of bilateral lacrimal glands: Secondary | ICD-10-CM | POA: Diagnosis not present

## 2021-12-09 DIAGNOSIS — H35373 Puckering of macula, bilateral: Secondary | ICD-10-CM | POA: Diagnosis not present

## 2021-12-09 DIAGNOSIS — H524 Presbyopia: Secondary | ICD-10-CM | POA: Diagnosis not present

## 2021-12-09 DIAGNOSIS — H26491 Other secondary cataract, right eye: Secondary | ICD-10-CM | POA: Diagnosis not present

## 2021-12-09 DIAGNOSIS — H52203 Unspecified astigmatism, bilateral: Secondary | ICD-10-CM | POA: Diagnosis not present

## 2021-12-23 ENCOUNTER — Other Ambulatory Visit: Payer: Self-pay | Admitting: Family Medicine

## 2021-12-23 DIAGNOSIS — R052 Subacute cough: Secondary | ICD-10-CM

## 2022-01-09 ENCOUNTER — Other Ambulatory Visit: Payer: Medicare PPO

## 2022-01-20 ENCOUNTER — Other Ambulatory Visit: Payer: Self-pay

## 2022-01-20 DIAGNOSIS — E538 Deficiency of other specified B group vitamins: Secondary | ICD-10-CM | POA: Diagnosis not present

## 2022-01-21 LAB — B12 AND FOLATE PANEL
Folate: 16 ng/mL (ref >3.0)
Vitamin B-12: 504 pg/mL (ref 232–1245)

## 2022-01-21 LAB — SPECIMEN STATUS REPORT

## 2022-01-27 ENCOUNTER — Encounter: Payer: Self-pay | Admitting: Family Medicine

## 2022-01-27 ENCOUNTER — Ambulatory Visit: Payer: Medicare PPO | Admitting: Family Medicine

## 2022-01-28 ENCOUNTER — Other Ambulatory Visit: Payer: Self-pay | Admitting: Family Medicine

## 2022-02-06 ENCOUNTER — Other Ambulatory Visit: Payer: Medicare PPO

## 2022-04-02 ENCOUNTER — Ambulatory Visit: Payer: Medicare PPO | Admitting: Family Medicine

## 2022-05-20 ENCOUNTER — Other Ambulatory Visit: Payer: Medicare PPO

## 2022-05-20 DIAGNOSIS — E538 Deficiency of other specified B group vitamins: Secondary | ICD-10-CM | POA: Diagnosis not present

## 2022-05-21 LAB — CBC WITH DIFFERENTIAL/PLATELET
Basophils Absolute: 0 10*3/uL (ref 0.0–0.2)
Basos: 0 %
EOS (ABSOLUTE): 0.1 10*3/uL (ref 0.0–0.4)
Eos: 3 %
Hematocrit: 37.3 % (ref 34.0–46.6)
Hemoglobin: 12.2 g/dL (ref 11.1–15.9)
Immature Grans (Abs): 0 10*3/uL (ref 0.0–0.1)
Immature Granulocytes: 0 %
Lymphocytes Absolute: 1.5 10*3/uL (ref 0.7–3.1)
Lymphs: 35 %
MCH: 30.2 pg (ref 26.6–33.0)
MCHC: 32.7 g/dL (ref 31.5–35.7)
MCV: 92 fL (ref 79–97)
Monocytes Absolute: 0.4 10*3/uL (ref 0.1–0.9)
Monocytes: 10 %
Neutrophils Absolute: 2.2 10*3/uL (ref 1.4–7.0)
Neutrophils: 52 %
Platelets: 206 10*3/uL (ref 150–450)
RBC: 4.04 x10E6/uL (ref 3.77–5.28)
RDW: 12.2 % (ref 11.7–15.4)
WBC: 4.3 10*3/uL (ref 3.4–10.8)

## 2022-05-21 LAB — B12 AND FOLATE PANEL
Folate: >20 ng/mL (ref >3.0)
Vitamin B-12: >2000 pg/mL — ABNORMAL HIGH (ref 232–1245)

## 2022-06-02 ENCOUNTER — Emergency Department (HOSPITAL_COMMUNITY): Payer: Medicare PPO

## 2022-06-02 ENCOUNTER — Other Ambulatory Visit: Payer: Self-pay

## 2022-06-02 ENCOUNTER — Observation Stay (HOSPITAL_COMMUNITY)
Admission: EM | Admit: 2022-06-02 | Discharge: 2022-06-04 | Disposition: A | Discharge status: Home / Self Care | Payer: Medicare PPO | Attending: Family Medicine | Admitting: Family Medicine

## 2022-06-02 ENCOUNTER — Encounter (HOSPITAL_COMMUNITY): Payer: Self-pay

## 2022-06-02 DIAGNOSIS — L509 Urticaria, unspecified: Secondary | ICD-10-CM | POA: Insufficient documentation

## 2022-06-02 DIAGNOSIS — G9340 Encephalopathy, unspecified: Secondary | ICD-10-CM | POA: Diagnosis not present

## 2022-06-02 DIAGNOSIS — F05 Delirium due to known physiological condition: Secondary | ICD-10-CM | POA: Insufficient documentation

## 2022-06-02 DIAGNOSIS — R9431 Abnormal electrocardiogram [ECG] [EKG]: Secondary | ICD-10-CM | POA: Diagnosis not present

## 2022-06-02 DIAGNOSIS — Z96652 Presence of left artificial knee joint: Secondary | ICD-10-CM | POA: Insufficient documentation

## 2022-06-02 DIAGNOSIS — R410 Disorientation, unspecified: Secondary | ICD-10-CM

## 2022-06-02 DIAGNOSIS — R41841 Cognitive communication deficit: Secondary | ICD-10-CM | POA: Insufficient documentation

## 2022-06-02 DIAGNOSIS — Z1152 Encounter for screening for COVID-19: Secondary | ICD-10-CM | POA: Diagnosis not present

## 2022-06-02 DIAGNOSIS — E8721 Acute metabolic acidosis: Secondary | ICD-10-CM | POA: Insufficient documentation

## 2022-06-02 DIAGNOSIS — Z79899 Other long term (current) drug therapy: Secondary | ICD-10-CM | POA: Insufficient documentation

## 2022-06-02 DIAGNOSIS — G9341 Metabolic encephalopathy: Secondary | ICD-10-CM | POA: Diagnosis present

## 2022-06-02 DIAGNOSIS — I639 Cerebral infarction, unspecified: Principal | ICD-10-CM | POA: Insufficient documentation

## 2022-06-02 DIAGNOSIS — R4182 Altered mental status, unspecified: Secondary | ICD-10-CM | POA: Diagnosis not present

## 2022-06-02 DIAGNOSIS — I63531 Cerebral infarction due to unspecified occlusion or stenosis of right posterior cerebral artery: Secondary | ICD-10-CM

## 2022-06-02 DIAGNOSIS — R109 Unspecified abdominal pain: Secondary | ICD-10-CM

## 2022-06-02 DIAGNOSIS — E872 Acidosis, unspecified: Secondary | ICD-10-CM

## 2022-06-02 DIAGNOSIS — Z9181 History of falling: Secondary | ICD-10-CM

## 2022-06-02 DIAGNOSIS — I6381 Other cerebral infarction due to occlusion or stenosis of small artery: Secondary | ICD-10-CM | POA: Diagnosis not present

## 2022-06-02 HISTORY — DX: Deficiency of other specified B group vitamins: E53.8

## 2022-06-02 LAB — COMPREHENSIVE METABOLIC PANEL
ALT: 39 U/L (ref 0–44)
AST: 54 U/L — ABNORMAL HIGH (ref 15–41)
Albumin: 3.9 g/dL (ref 3.5–5.0)
Alkaline Phosphatase: 43 U/L (ref 38–126)
Anion gap: 14 (ref 5–15)
BUN: 9 mg/dL (ref 8–23)
CO2: 20 mmol/L — ABNORMAL LOW (ref 22–32)
Calcium: 9.1 mg/dL (ref 8.9–10.3)
Chloride: 105 mmol/L (ref 98–111)
Creatinine, Ser: 0.69 mg/dL (ref 0.44–1.00)
GFR, Estimated: >60 mL/min (ref >60)
Glucose, Bld: 115 mg/dL — ABNORMAL HIGH (ref 70–99)
Potassium: 3.8 mmol/L (ref 3.5–5.1)
Sodium: 139 mmol/L (ref 135–145)
Total Bilirubin: 0.7 mg/dL (ref 0.3–1.2)
Total Protein: 7 g/dL (ref 6.5–8.1)

## 2022-06-02 LAB — CBC WITH DIFFERENTIAL/PLATELET
Abs Immature Granulocytes: 0.02 10*3/uL (ref 0.00–0.07)
Basophils Absolute: 0 10*3/uL (ref 0.0–0.1)
Basophils Relative: 0 %
Eosinophils Absolute: 0.1 10*3/uL (ref 0.0–0.5)
Eosinophils Relative: 1 %
HCT: 36.5 % (ref 36.0–46.0)
Hemoglobin: 12.3 g/dL (ref 12.0–15.0)
Immature Granulocytes: 0 %
Lymphocytes Relative: 16 %
Lymphs Abs: 1.2 10*3/uL (ref 0.7–4.0)
MCH: 31.5 pg (ref 26.0–34.0)
MCHC: 33.7 g/dL (ref 30.0–36.0)
MCV: 93.4 fL (ref 80.0–100.0)
Monocytes Absolute: 0.8 10*3/uL (ref 0.1–1.0)
Monocytes Relative: 10 %
Neutro Abs: 5.6 10*3/uL (ref 1.7–7.7)
Neutrophils Relative %: 73 %
Platelets: 169 10*3/uL (ref 150–400)
RBC: 3.91 MIL/uL (ref 3.87–5.11)
RDW: 12.3 % (ref 11.5–15.5)
WBC: 7.7 10*3/uL (ref 4.0–10.5)
nRBC: 0 % (ref 0.0–0.2)

## 2022-06-02 LAB — URINALYSIS, ROUTINE W REFLEX MICROSCOPIC
Bilirubin Urine: NEGATIVE
Glucose, UA: NEGATIVE mg/dL
Hgb urine dipstick: NEGATIVE
Ketones, ur: 5 mg/dL — AB
Leukocytes,Ua: NEGATIVE
Nitrite: NEGATIVE
Protein, ur: NEGATIVE mg/dL
Specific Gravity, Urine: 1.016 (ref 1.005–1.030)
pH: 6 (ref 5.0–8.0)

## 2022-06-02 LAB — RESP PANEL BY RT-PCR (RSV, FLU A&B, COVID)  RVPGX2
Influenza A by PCR: NEGATIVE
Influenza B by PCR: NEGATIVE
Resp Syncytial Virus by PCR: NEGATIVE
SARS Coronavirus 2 by RT PCR: NEGATIVE

## 2022-06-02 NOTE — ED Triage Notes (Signed)
Pt c/o AMS. Pt became confused around 1900 yesterday. Pt's LKW was 06/01/22 at [facility]. Pt c/o frequent urination, weakness, HA, abdominal pain started yesterday.

## 2022-06-02 NOTE — ED Provider Triage Note (Signed)
Emergency Medicine Provider Triage Evaluation Note  Natalie Mcfarland , a 82 y.o. female  was evaluated in triage.  Pt complains of altered mental status.  Per patient's daughter and sons report, she has been increasingly confused starting yesterday evening. She was unable to locate the bathroom in her own home. Son reports that patient became incontinent while outside earlier today. Son reports that patient has a Public librarian with [employer], Dr. Aggie Moats, who was recommending admission for suspected UTI.  Review of Systems  Positive: As above Negative: As above  Physical Exam  BP 132/72   Pulse 65   Temp 99.1 F (37.3 C) (Oral)   Resp 18   Ht '4\' 4"'$  (1.321 m)   Wt 75.3 kg   SpO2 96%   BMI 43.16 kg/m  Gen:   Awake, no distress  Resp:  Normal effort  MSK:   Moves extremities without difficulty Other:  No TTP in abdomen, but remarks that she feels her abdomen is full  Medical Decision Making  Medically screening exam initiated at 8:42 PM.  Appropriate orders placed.  Natalie Mcfarland was informed that the remainder of the evaluation will be completed by another provider, this initial triage assessment does not replace that evaluation, and the importance of remaining in the ED until their evaluation is complete.     Luvenia Heller, PA-C 06/02/22 2046

## 2022-06-02 NOTE — ED Notes (Signed)
Patient transported to CT 

## 2022-06-03 ENCOUNTER — Observation Stay (HOSPITAL_COMMUNITY): Payer: Medicare PPO

## 2022-06-03 ENCOUNTER — Encounter (HOSPITAL_COMMUNITY): Payer: Self-pay | Admitting: Internal Medicine

## 2022-06-03 DIAGNOSIS — I6621 Occlusion and stenosis of right posterior cerebral artery: Secondary | ICD-10-CM | POA: Diagnosis not present

## 2022-06-03 DIAGNOSIS — R4182 Altered mental status, unspecified: Secondary | ICD-10-CM | POA: Diagnosis not present

## 2022-06-03 DIAGNOSIS — R109 Unspecified abdominal pain: Secondary | ICD-10-CM | POA: Diagnosis not present

## 2022-06-03 DIAGNOSIS — G9340 Encephalopathy, unspecified: Secondary | ICD-10-CM | POA: Diagnosis not present

## 2022-06-03 DIAGNOSIS — H53462 Homonymous bilateral field defects, left side: Secondary | ICD-10-CM | POA: Diagnosis not present

## 2022-06-03 DIAGNOSIS — G9341 Metabolic encephalopathy: Secondary | ICD-10-CM | POA: Diagnosis present

## 2022-06-03 DIAGNOSIS — I639 Cerebral infarction, unspecified: Secondary | ICD-10-CM

## 2022-06-03 DIAGNOSIS — Z9181 History of falling: Secondary | ICD-10-CM

## 2022-06-03 DIAGNOSIS — L509 Urticaria, unspecified: Secondary | ICD-10-CM

## 2022-06-03 DIAGNOSIS — E872 Acidosis, unspecified: Secondary | ICD-10-CM

## 2022-06-03 LAB — COMPREHENSIVE METABOLIC PANEL
ALT: 31 U/L (ref 0–44)
AST: 65 U/L — ABNORMAL HIGH (ref 15–41)
Albumin: 3.5 g/dL (ref 3.5–5.0)
Alkaline Phosphatase: 39 U/L (ref 38–126)
Anion gap: 9 (ref 5–15)
BUN: 8 mg/dL (ref 8–23)
CO2: 24 mmol/L (ref 22–32)
Calcium: 8.7 mg/dL — ABNORMAL LOW (ref 8.9–10.3)
Chloride: 105 mmol/L (ref 98–111)
Creatinine, Ser: 0.67 mg/dL (ref 0.44–1.00)
GFR, Estimated: >60 mL/min (ref >60)
Glucose, Bld: 114 mg/dL — ABNORMAL HIGH (ref 70–99)
Potassium: 4.1 mmol/L (ref 3.5–5.1)
Sodium: 138 mmol/L (ref 135–145)
Total Bilirubin: 1.3 mg/dL — ABNORMAL HIGH (ref 0.3–1.2)
Total Protein: 6.2 g/dL — ABNORMAL LOW (ref 6.5–8.1)

## 2022-06-03 LAB — LDL CHOLESTEROL, DIRECT: Direct LDL: 137 mg/dL — ABNORMAL HIGH (ref 0–99)

## 2022-06-03 LAB — RAPID URINE DRUG SCREEN, HOSP PERFORMED
Amphetamines: NOT DETECTED
Barbiturates: NOT DETECTED
Benzodiazepines: NOT DETECTED
Cocaine: NOT DETECTED
Opiates: NOT DETECTED
Tetrahydrocannabinol: NOT DETECTED

## 2022-06-03 LAB — HEMOGLOBIN A1C
Hgb A1c MFr Bld: 5.6 % (ref 4.8–5.6)
Mean Plasma Glucose: 114 mg/dL

## 2022-06-03 LAB — AMMONIA: Ammonia: 26 umol/L (ref 9–35)

## 2022-06-03 LAB — TSH: TSH: 1.076 u[IU]/mL (ref 0.350–4.500)

## 2022-06-03 MED ORDER — ASPIRIN 81 MG PO TBEC
81.0000 mg | DELAYED_RELEASE_TABLET | Freq: Every day | ORAL | Status: DC
  Administered 2022-06-03 – 2022-06-04 (×2): 81 mg via ORAL
  Filled 2022-06-03 (×2): qty 1

## 2022-06-03 MED ORDER — LORATADINE 10 MG PO TABS: 10.0000 mg | ORAL_TABLET | Freq: Every day | ORAL | Status: DC | PRN

## 2022-06-03 MED ORDER — SODIUM CHLORIDE 0.9 % IV SOLN
1.0000 g | INTRAVENOUS | Status: DC
  Administered 2022-06-03: 1 g via INTRAVENOUS
  Filled 2022-06-03: qty 10

## 2022-06-03 MED ORDER — SODIUM CHLORIDE 0.9 % IV BOLUS
500.0000 mL | Freq: Once | INTRAVENOUS | Status: AC
  Administered 2022-06-03: 500 mL via INTRAVENOUS

## 2022-06-03 MED ORDER — LORATADINE 10 MG PO TABS
10.0000 mg | ORAL_TABLET | Freq: Once | ORAL | Status: AC
  Administered 2022-06-03: 10 mg via ORAL
  Filled 2022-06-03: qty 1

## 2022-06-03 MED ORDER — IOHEXOL 350 MG/ML SOLN
75.0000 mL | Freq: Once | INTRAVENOUS | Status: AC | PRN
  Administered 2022-06-03: 75 mL via INTRAVENOUS

## 2022-06-03 MED ORDER — LORATADINE 10 MG PO TABS: 10.0000 mg | ORAL_TABLET | Freq: Every day | ORAL | Status: DC

## 2022-06-03 MED ORDER — PANTOPRAZOLE SODIUM 40 MG PO TBEC
40.0000 mg | DELAYED_RELEASE_TABLET | Freq: Every day | ORAL | Status: DC
  Administered 2022-06-03 – 2022-06-04 (×2): 40 mg via ORAL
  Filled 2022-06-03 (×2): qty 1

## 2022-06-03 MED ORDER — SODIUM CHLORIDE 0.9 % IV SOLN
1.0000 g | Freq: Once | INTRAVENOUS | Status: AC
  Administered 2022-06-03: 1 g via INTRAVENOUS
  Filled 2022-06-03: qty 10

## 2022-06-03 MED ORDER — ACETAMINOPHEN 325 MG PO TABS
650.0000 mg | ORAL_TABLET | Freq: Four times a day (QID) | ORAL | Status: DC | PRN
  Administered 2022-06-03: 650 mg via ORAL
  Filled 2022-06-03: qty 2

## 2022-06-03 MED ORDER — ORAL CARE MOUTH RINSE: 15.0000 mL | OROMUCOSAL | Status: DC | PRN

## 2022-06-03 MED ORDER — CLOPIDOGREL BISULFATE 75 MG PO TABS
75.0000 mg | ORAL_TABLET | Freq: Every day | ORAL | Status: DC
  Administered 2022-06-03 – 2022-06-04 (×2): 75 mg via ORAL
  Filled 2022-06-03 (×2): qty 1

## 2022-06-03 MED ORDER — ACETAMINOPHEN 650 MG RE SUPP: 650.0000 mg | Freq: Four times a day (QID) | RECTAL | Status: DC | PRN

## 2022-06-03 MED ORDER — STROKE: EARLY STAGES OF RECOVERY BOOK
Freq: Once | Status: AC
  Filled 2022-06-03: qty 1

## 2022-06-03 MED ORDER — SODIUM CHLORIDE 0.9 % IV SOLN: INTRAVENOUS | Status: AC

## 2022-06-03 NOTE — H&P (Signed)
History and Physical    Eira Nanton [id number] DOB: 02-15-1941 DOA: 06/02/2022  PCP: Elwin Mocha, MD  Patient coming from: Home  Chief Complaint: Altered mental status  HPI: Natalie Mcfarland is a 82 y.o. female with medical history significant of left knee osteoarthritis, GERD, gastritis, vitamin B12 deficiency presented to the ED with altered mental status. Vital signs on arrival to the ED: Temperature 99.1 F, pulse 65, respiratory rate 18, blood pressure 132/72, SpO2 96% on room air.  Labs showing no leukocytosis, bicarb 20, anion gap 14, glucose 115, creatinine 0.6, AST slightly elevated at 54 but remainder of LFTs normal, UA not suggestive of infection, COVID/influenza A and B/RSV PCR negative.  Urine culture pending.  Blood cultures drawn.  CT head negative for acute intracranial process.  Chest x-ray showing no active cardiopulmonary disease. Patient was given Claritin for urticaria.  She was given ceftriaxone for possible UTI and 500 cc normal saline bolus.  [facility] called to admit.  Patient provided by the patient, her daughter at bedside, and son over the phone.  Son is a Engineer, drilling.  Family is concerned that the patient has been confused and hallucinating since last night.  She was not able to locate the bathroom in her own home and urinated outside on the deck.  Her concierge physician was concerned that this could be due to a UTI as her temperature was 99.4 F at home.  She does not have a history of dementia.  Also family mentions patient having vague abdominal discomfort and some constipation.  No nausea or vomiting.  She has been consuming flaxseeds for the past few weeks.  Her urine has been dark in color and having urinary frequency and urgency.  Patient's son mentions that her most recent EGD and colonoscopy have been negative.  Patient denies cough or shortness of breath.  Per family, her mental status has improved since she has been in the ED.  Family also concerned about possible  fall risk due to patient having chronic knee problems.  Patient noted to have diffuse urticaria on her trunk and upper extremities which per son is chronic and stress-induced. She receives monthly B12 injections and takes Claritin for urticaria but no other medications.  Review of Systems:  Review of Systems  All other systems reviewed and are negative.   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 She has never used smokeless tobacco. She reports that she does not drink alcohol and does not use drugs.  Allergies  Allergen Reactions   Other Rash    Pt had a rash from a pain medicine but doesn't remember which one    History reviewed. No pertinent family history.  Prior to Admission medications   Medication Sig Start Date End Date Taking? Authorizing Provider  Cyanocobalamin (VITAMIN DEFICIENCY SYSTEM-B12) 1000 MCG/ML KIT Inject 1,000 mcg as directed every 30 (thirty) days.   Yes [provider]  loratadine (CLARITIN) 10 MG tablet Take 10 mg by mouth  daily.   Yes [provider]    Physical Exam: Vitals:   06/03/22 0000 06/03/22 0030 06/03/22 0100 06/03/22 0145  BP: 112/78 (!) 130/99 (!) 137/115 122/81  Pulse: 60 (!) 57 (!) 51 (!) 58  Resp:      Temp:      TempSrc:      SpO2: 94% 99% 94% 92%  Weight:      Height:        Physical Exam Vitals reviewed.  Constitutional:      General: She is not in acute distress. HENT:     Head: Normocephalic and atraumatic.  Eyes:     Extraocular Movements:  Extraocular movements intact.  Cardiovascular:     Rate and Rhythm: Normal rate and regular rhythm.     Pulses: Normal pulses.  Pulmonary:     Effort: Pulmonary effort is normal. No respiratory distress.     Breath sounds: Normal breath sounds. No wheezing or rales.  Abdominal:     General: Bowel sounds are normal. There is no distension.     Palpations: Abdomen is soft.     Tenderness: There is abdominal tenderness. There is no guarding.     Comments: Mild suprapubic tenderness  Musculoskeletal:     Cervical back: Normal range of motion. No rigidity.     Right lower leg: No edema.     Left lower leg: No edema.  Skin:    General: Skin is warm and dry.     Comments: Diffuse urticaria involving trunk and upper extremities  Neurological:     General: No focal deficit present.     Mental Status: She is alert and oriented to person, place, and time.     Cranial Nerves: No cranial nerve deficit.     Sensory: No sensory deficit.     Motor: No weakness.     Labs on Admission: I have personally reviewed following labs and imaging studies  CBC: Recent Labs  Lab 06/02/22 2029  WBC 7.7  NEUTROABS 5.6  HGB 12.3  HCT 36.5  MCV 93.4  PLT 123XX123   Basic Metabolic Panel: Recent Labs  Lab 06/02/22 2029  NA 139  K 3.8  CL 105  CO2 20*  GLUCOSE 115*  BUN 9  CREATININE 0.69  CALCIUM 9.1   GFR: Estimated Creatinine Clearance: 39 mL/min (by C-G formula based on SCr of 0.69 mg/dL). Liver Function Tests: Recent Labs  Lab 06/02/22 2029  AST 54*  ALT 39  ALKPHOS 43  BILITOT 0.7  PROT 7.0  ALBUMIN 3.9   No results for input(s): "LIPASE", "AMYLASE" in the last 168 hours. No results for input(s): "AMMONIA" in the last 168 hours. Coagulation Profile: No results for input(s): "INR", "PROTIME" in the last 168 hours. Cardiac Enzymes: No results for input(s): "CKTOTAL", "CKMB", "CKMBINDEX", "TROPONINI" in the last 168 hours. BNP (last 3 results) No results for input(s): "PROBNP"  in the last 8760 hours. HbA1C: No results for input(s): "HGBA1C" in the last 72 hours. CBG: No results for input(s): "GLUCAP" in the last 168 hours. Lipid Profile: No results for input(s): "CHOL", "HDL", "LDLCALC", "TRIG", "CHOLHDL", "LDLDIRECT" in the last 72 hours. Thyroid Function Tests: No results for input(s): "TSH", "T4TOTAL", "FREET4", "T3FREE", "THYROIDAB" in the last 72 hours. Anemia Panel: No results for input(s): "VITAMINB12", "FOLATE", "FERRITIN", "TIBC", "IRON", "RETICCTPCT" in the last 72 hours. Urine analysis:    Component Value Date/Time   COLORURINE YELLOW 06/02/2022 2325   APPEARANCEUR CLEAR 06/02/2022 2325   LABSPEC  1.016 06/02/2022 2325   PHURINE 6.0 06/02/2022 2325   GLUCOSEU NEGATIVE 06/02/2022 2325   HGBUR NEGATIVE 06/02/2022 2325   BILIRUBINUR NEGATIVE 06/02/2022 2325   KETONESUR 5 (A) 06/02/2022 2325   PROTEINUR NEGATIVE 06/02/2022 2325   UROBILINOGEN 0.2 07/25/2012 0745   NITRITE NEGATIVE 06/02/2022 2325   LEUKOCYTESUR NEGATIVE 06/02/2022 2325    Radiological Exams on Admission: CT Head Wo Contrast  Result Date: 06/02/2022 CLINICAL DATA:  Altered mental status EXAM: CT HEAD WITHOUT CONTRAST TECHNIQUE: Contiguous axial images were obtained from the base of the skull through the vertex without intravenous contrast. RADIATION DOSE REDUCTION: This exam was performed according to the departmental dose-optimization program which includes automated exposure control, adjustment of the mA and/or kV according to patient size and/or use of iterative reconstruction technique. COMPARISON:  08/29/2019 FINDINGS: Brain: No evidence of acute infarction, hemorrhage, mass, mass effect, or midline shift. No hydrocephalus or extra-axial fluid collection. Right thalamic lacunar infarct appears chronic but is new compared to 2021. Periventricular white matter changes, likely the sequela of chronic small vessel ischemic disease. Vascular: No hyperdense vessel. Skull: Negative for  fracture or focal lesion. Sinuses/Orbits: Mild mucosal thickening in right maxillary sinus. Status post bilateral lens replacements. Other: The mastoid air cells are well aerated. IMPRESSION: No acute intracranial process. Electronically Signed   By: Merilyn Baba M.D.   On: 06/02/2022 23:01   DG Chest 2 View  Result Date: 06/02/2022 CLINICAL DATA:  Altered mental status EXAM: CHEST - 2 VIEW COMPARISON:  07/08/2012 FINDINGS: Heart is borderline in size. Lungs clear. No effusions or edema. No acute bony abnormality. IMPRESSION: No active cardiopulmonary disease. Electronically Signed   By: Rolm Baptise M.D.   On: 06/02/2022 21:12    EKG: Independently reviewed.  Sinus rhythm with PVCs.  Assessment and Plan  Acute encephalopathy No history of dementia and per family she has been very confused and hallucinating since yesterday evening.  No history of dementia.  Her mental status has improved significantly since she has been in the ED and currently answering questions appropriately.  UA not showing signs of infection but patient is endorsing urinary symptoms.  CT head negative for acute finding and no focal neurodeficit on exam.  She is afebrile in the ED (most recent temperature 98.2 F).  No leukocytosis on labs.  No meningeal signs.  Plan is to continue ceftriaxone for possible UTI.  Urine culture ordered.  She is on monthly B12 injections and B12 level >2000 on recent labs done 05/20/2022.  Will check TSH and ammonia level.  Check UDS.  Brain MRI ordered.  Abdominal discomfort Concern for some constipation but no nausea or vomiting. AST minimally elevated at 54 and remainder of LFTs normal.  Abdomen soft on exam and not distended, bowel sounds present, but does have some mild suprapubic tenderness which could be due to possible UTI.  KUB ordered and will also do bladder scan.  Chronic stress-induced urticaria Continue symptomatic management: Claritin as needed.  Borderline normal anion gap metabolic  acidosis Gentle IV fluid hydration and repeat labs in the morning.  Fall risk due to chronic knee problems Fall precautions and PT/OT eval.  DVT prophylaxis: SCDs at this time.  Avoid anticoagulation for DVT prophylaxis until KUB is done. Code Status: Full Code (discussed with the patient's son) Family Communication: Daughter at bedside and son on speaker phone. Level of care: Med-Surg Admission status: It is my clinical opinion that referral for OBSERVATION is reasonable and necessary in this patient based  on the above information provided. The aforementioned taken together are felt to place the patient at high risk for further clinical deterioration. However, it is anticipated that the patient may be medically stable for discharge from the hospital within 24 to 48 hours.   Shela Leff MD [facility]  If 7PM-7AM, please contact night-coverage [email or web address]  06/03/2022, 2:23 AM

## 2022-06-03 NOTE — ED Notes (Addendum)
Patient assisted to bathroom in the room. Daughter is at bedside.

## 2022-06-03 NOTE — ED Notes (Signed)
OT at bedside to assess pt

## 2022-06-03 NOTE — Consult Note (Addendum)
NEUROLOGY CONSULTATION NOTE   Date of service: June 03, 2022 Patient Name: Natalie Mcfarland MRN:  [id number] DOB:  September 25, 1940 Reason for consult: stroke Requesting physician: Benjaman Lobe MD _ _ _   _ __   _ __ _ _  __ __   _ __   __ _  History of Present Illness   This is an 82 year old woman who presented to the ED yesterday with altered mental status.  Neurology is consulted for acute ischemic stroke seen on MRI.  Past medical history includes B12 deficiency and osteoarthritis.  Last known well was the day prior.  That evening she had an episode of hallucinations and was confused, believing she was in a different house than she was.  She was unable to locate her bathroom and went outside and urinated on the deck.  Mental status has improved but is not quite back to baseline.  No prior similar episodes.  No recent illness.  UA negative in ED.  MRI brain showed acute R PCA infarct with thalamic and posterior temporal lobe involvement (personal review). She has no focal neuro deficits other than a L homonymous hemianopsia. Delayed recall 2/3.   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 No pertinent family history. Social History   Socioeconomic History   Marital status: Married    Spouse name: Not on file   Number of children: Not on file   Years of education: Not on file   Highest education  level: Not on file  Occupational History   Not on file  Tobacco Use   Smoking status: Never   Smokeless tobacco: Never  Substance and Sexual Activity   Alcohol use: No   Drug use: No   Sexual activity: Not on file  Other Topics Concern   Not on file  Social History Narrative   Not on file   Social Determinants of Health   Financial Resource Strain: Not on file  Food Insecurity: Not on file  Transportation Needs: Not on file  Physical Activity: Not on file  Stress: Not on file  Social Connections: Not on file   Allergies  Allergen Reactions   Other Rash    Pt had a rash from a pain medicine but doesn't remember which one    Medications   (Not in a hospital admission)     Current Facility-Administered Medications:    0.9 %  sodium chloride infusion, , Intravenous, Continuous, Shela Leff, MD, Last Rate: 75 mL/hr at 06/03/22 0416, New Bag at 06/03/22 0416   acetaminophen (TYLENOL) tablet 650 mg, 650 mg, Oral, Q6H PRN **OR** acetaminophen (TYLENOL) suppository 650 mg, 650 mg, Rectal, Q6H PRN, Shela Leff, MD   cefTRIAXone (ROCEPHIN) 1 g in sodium chloride 0.9 % 100 mL IVPB, 1 g, Intravenous, Q24H, Shela Leff, MD   [START ON 06/04/2022] loratadine (CLARITIN) tablet  10 mg, 10 mg, Oral, Daily PRN, Shela Leff, MD  Current Outpatient Medications:    Cyanocobalamin (VITAMIN DEFICIENCY SYSTEM-B12) 1000 MCG/ML KIT, Inject 1,000 mcg as directed every 30 (thirty) days., Disp: , Rfl:    loratadine (CLARITIN) 10 MG tablet, Take 10 mg by mouth daily., Disp: , Rfl:   Vitals   Vitals:   06/03/22 0652 06/03/22 0700 06/03/22 0900 06/03/22 1022  BP:  129/70 117/88   Pulse:  63 61 61  Resp:    16  Temp: 99.1 F (37.3 C)   98.1 F (36.7 C)  TempSrc: Oral   Oral  SpO2:  96% 94% 97%  Weight:      Height:         Body mass index is 43.16 kg/m.  Physical Exam   Physical Exam Gen: A&O x4, NAD HEENT: Atraumatic, normocephalic;mucous membranes moist;  oropharynx clear, tongue without atrophy or fasciculations. Neck: Supple, trachea midline. Resp: CTAB, no w/r/r CV: RRR, no m/g/r; nml S1 and S2. 2+ symmetric peripheral pulses. Abd: soft/NT/ND; nabs x 4 quad Extrem: Nml bulk; no cyanosis, clubbing, or edema.  Neuro: *MS: A&O x4. Follows multi-step commands.  *Speech: fluid, nondysarthric, able to name and repeat *CN:    I: Deferred   II,III: PERRLA, L homonymous hemianopsia, optic discs unable to be visualized 2/2 pupillary constriction   III,IV,VI: EOMI w/o nystagmus, no ptosis   V: Sensation intact from V1 to V3 to LT   VII: Eyelid closure was full.  Smile symmetric.   VIII: Hearing intact to voice   IX,X: Voice normal, palate elevates symmetrically    XI: SCM/trap 5/5 bilat   XII: Tongue protrudes midline, no atrophy or fasciculations   *Motor:   Normal bulk.  No tremor, rigidity or bradykinesia. No pronator drift.    Strength: Dlt Bic Tri WrE WrF FgS Gr HF KnF KnE PlF DoF    Left '5 5 5 5 5 5 5 5 5 5 5 5    '$ Right '5 5 5 5 5 5 5 5 5 5 5 5    '$ *Sensory: Intact to light touch, pinprick, temperature vibration throughout. Symmetric. Propioception intact bilat.  No double-simultaneous extinction.  *Coordination:  Finger-to-nose, heel-to-shin, rapid alternating motions were intact. *Reflexes:  2+ and symmetric throughout without clonus; toes down-going bilat *Gait: deferred  NIHSS = 1  Premorbid mRS = 0   Labs   CBC:  Recent Labs  Lab 06/02/22 2029  WBC 7.7  NEUTROABS 5.6  HGB 12.3  HCT 36.5  MCV 93.4  PLT 123XX123    Basic Metabolic Panel:  Lab Results  Component Value Date   NA 138 06/03/2022   K 4.1 06/03/2022   CO2 24 06/03/2022   GLUCOSE 114 (H) 06/03/2022   BUN 8 06/03/2022   CREATININE 0.67 06/03/2022   CALCIUM 8.7 (L) 06/03/2022   GFRNONAA >60 06/03/2022   GFRAA >90 07/27/2012   Lipid Panel: No results found for: "LDLCALC" HgbA1c: No results found for: "HGBA1C" Urine Drug Screen:     Component Value  Date/Time   LABOPIA NONE DETECTED 06/03/2022 0420   COCAINSCRNUR NONE DETECTED 06/03/2022 0420   LABBENZ NONE DETECTED 06/03/2022 0420   AMPHETMU NONE DETECTED 06/03/2022 0420   THCU NONE DETECTED 06/03/2022 0420   LABBARB NONE DETECTED 06/03/2022 0420    Alcohol Level No results found for: "ETH"  MRI brain wo Acute Right PCA territory infarct. Confluent involvement of the medial occipital lobe. Mild involvement of the posterior right temporal lobe  and right thalamus. Trace occipital petechial hemorrhage, but no malignant hemorrhagic transformation or significant mass effect.  CNS imaging personally reviewed; I agree with above interpretation  Impression   This is an 82 year old woman who presented to the ED yesterday with altered mental status who was found to have an acute R PCA infarct on MRI brain. Her confusion has significantly improved although family is not sure if she is truly back to baseline. She lives alone with her aging husband who requires assistance and family requests speech language/cognitive eval for clarification.   Recommendations   - Permissive HTN x48 hrs from sx onset goal BP <220/110. PRN labetalol or hydralazine if BP above these parameters. Avoid oral antihypertensives. - CTA or MRA H&N (posterior circulation stroke - carotid dopplers not sufficient) - TTE  - Check A1c and LDL + add statin per guidelines - ASA '81mg'$  daily + plavix '75mg'$  daily x21 days f/b ASA '81mg'$  daily monotherapy after that - q4 hr neuro checks - STAT head CT for any change in neuro exam - Tele - PT/OT - SLP: she lives alone with her aging husband who requires assistance and family requests speech language/cognitive eval for clarification.  - Stroke education - Amb referral to neurology upon discharge - She has no known vascular risk factors, will need amb cardiac monitoring after discharge to eval for possible occult a fib - No driving until she is cleared by outpatient neurologist or  outpatient OT functional driving evaluation (2/2 visual field deficits)  Stroke team will continue to follow  ______________________________________________________________________   Thank you for the opportunity to take part in the care of this patient. If you have any further questions, please contact the neurology consultation attending.  Signed,  Su Monks, MD [facility] [phone]  If 7pm- 7am, please page neurology on call as listed in [place].  **Any copied and pasted documentation in this note was written by me in another application not billed for and pasted by me into this document.  Addendum [id number]:  Distal R P4 segment. She is outside the window for intervention and would not qualify regardless due to mild symptoms. No change to above mgmt.  Su Monks, MD [facility] [phone]  If 7pm- 7am, please page neurology on call as listed in [place].

## 2022-06-03 NOTE — ED Notes (Signed)
[place] (caretaker) present and notified of pt room assignment

## 2022-06-03 NOTE — ED Notes (Signed)
ED TO INPATIENT HANDOFF REPORT  ED Nurse Name and Phone #: Effie Shy Name/Age/Gender Natalie Mcfarland 82 y.o. female Room/Bed: 009C/009C  Code Status   Code Status: Full Code  Home/SNF/Other Home Patient oriented to: self, place, time, and situation Is this baseline? Yes   Triage Complete: Triage complete  Chief Complaint Acute metabolic encephalopathy [id number]  Triage Note Pt c/o AMS. Pt became confused around 1900 yesterday. Pt's LKW was 06/01/22 at [place]. Pt c/o frequent urination, weakness, HA, abdominal pain started yesterday.   Allergies Allergies  Allergen Reactions   Other Rash    Pt had a rash from a pain medicine but doesn't remember which one    Level of Care/Admitting Diagnosis ED Disposition     ED Disposition  Admit   Condition  --   [place]: [place] [[id number]]  Level of Care: Med-Surg [16]  May place patient in observation at [facility] or [place] if equivalent level of care is available:: Yes  Covid Evaluation: Asymptomatic - no recent exposure (last 10 days) testing not required  Diagnosis: Acute metabolic encephalopathy [id number]  Admitting Physician: Shela Leff [id number]  Attending Physician: Shela Leff [id number]          B Medical/Surgery History Past Medical History:  Diagnosis Date   Acute metabolic encephalopathy [id number]   Arthritis    pain and oa left knee   Gastritis    past hx of gastritis -no problem since   GERD (gastroesophageal reflux disease)    Vitamin B12 deficiency    Past Surgical History:  Procedure Laterality Date   ESOPHAGOGASTRODUODENOSCOPY ENDOSCOPY     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   Procedure: TOTAL KNEE ARTHROPLASTY;  Surgeon: Gearlean Alf, MD;  Location: [facility];  Service: Orthopedics;  Laterality: Left;     A IV Location/Drains/Wounds Patient Lines/Drains/Airways Status     Active Line/Drains/Airways     Name Placement date  Placement time Site Days   Peripheral IV 06/03/22 20 G Anterior;Right Forearm 06/03/22  0035  Forearm  less than 1            Intake/Output Last 24 hours  Intake/Output Summary (Last 24 hours) at 06/03/2022 1311 Last data filed at 06/03/2022 0145 Gross per 24 hour  Intake 101.86 ml  Output --  Net 101.86 ml    Labs/Imaging Results for orders placed or performed during the hospital encounter of 06/02/22 (from the past 48 hour(s))  Comprehensive metabolic panel     Status: Abnormal   Collection Time: 06/02/22  8:29 PM  Result Value Ref Range   Sodium 139 135 - 145 mmol/L   Potassium 3.8 3.5 - 5.1 mmol/L   Chloride 105 98 - 111 mmol/L   CO2 20 (L) 22 - 32 mmol/L   Glucose, Bld 115 (H) 70 - 99 mg/dL    Comment: Glucose reference range applies only to samples taken after fasting for at least 8 hours.   BUN 9 8 - 23 mg/dL   Creatinine, Ser 0.69 0.44 - 1.00 mg/dL   Calcium 9.1 8.9 - 10.3 mg/dL   Total Protein 7.0 6.5 - 8.1 g/dL   Albumin 3.9 3.5 - 5.0 g/dL   AST 54 (H) 15 - 41 U/L   ALT 39 0 - 44 U/L   Alkaline Phosphatase 43 38 - 126 U/L   Total Bilirubin 0.7 0.3 - 1.2 mg/dL   GFR, Estimated >60 >60 mL/min    Comment: (NOTE) Calculated using the  CKD-EPI Creatinine Equation (2021)    Anion gap 14 5 - 15    Comment: Performed at [facility], [place] [address] [place], [place] [address]  CBC with Diff     Status: None   Collection Time: 06/02/22  8:29 PM  Result Value Ref Range   WBC 7.7 4.0 - 10.5 K/uL   RBC 3.91 3.87 - 5.11 MIL/uL   Hemoglobin 12.3 12.0 - 15.0 g/dL   HCT 36.5 36.0 - 46.0 %   MCV 93.4 80.0 - 100.0 fL   MCH 31.5 26.0 - 34.0 pg   MCHC 33.7 30.0 - 36.0 g/dL   RDW 12.3 11.5 - 15.5 %   Platelets 169 150 - 400 K/uL   nRBC 0.0 0.0 - 0.2 %   Neutrophils Relative % 73 %   Neutro Abs 5.6 1.7 - 7.7 K/uL   Lymphocytes Relative 16 %   Lymphs Abs 1.2 0.7 - 4.0 K/uL   Monocytes Relative 10 %   Monocytes Absolute 0.8 0.1 - 1.0 K/uL   Eosinophils Relative 1 %    Eosinophils Absolute 0.1 0.0 - 0.5 K/uL   Basophils Relative 0 %   Basophils Absolute 0.0 0.0 - 0.1 K/uL   Immature Granulocytes 0 %   Abs Immature Granulocytes 0.02 0.00 - 0.07 K/uL    Comment: Performed at [facility], [address], [place], [place] [address]  Resp panel by RT-PCR (RSV, Flu A&B, Covid) Anterior Nasal Swab     Status: None   Collection Time: 06/02/22  8:42 PM   Specimen: Anterior Nasal Swab  Result Value Ref Range   SARS Coronavirus 2 by RT PCR NEGATIVE NEGATIVE   Influenza A by PCR NEGATIVE NEGATIVE   Influenza B by PCR NEGATIVE NEGATIVE    Comment: (NOTE) The Xpert Xpress SARS-CoV-2/FLU/RSV plus assay is intended as an aid in the diagnosis of influenza from Nasopharyngeal swab specimens and should not be used as a sole basis for treatment. Nasal washings and aspirates are unacceptable for Xpert Xpress SARS-CoV-2/FLU/RSV testing.  Fact Sheet for Patients: EntrepreneurPulse.com.au  Fact Sheet for Healthcare Providers: IncredibleEmployment.be  This test is not yet approved or cleared by the [place] FDA and has been authorized for detection and/or diagnosis of SARS-CoV-2 by FDA under an Emergency Use Authorization (EUA). This EUA will remain in effect (meaning this test can be used) for the duration of the COVID-19 declaration under Section 564(b)(1) of the Act, 21 U.S.C. section 360bbb-3(b)(1), unless the authorization is terminated or revoked.     Resp Syncytial Virus by PCR NEGATIVE NEGATIVE    Comment: (NOTE) Fact Sheet for Patients: EntrepreneurPulse.com.au  Fact Sheet for Healthcare Providers: IncredibleEmployment.be  This test is not yet approved or cleared by the [place] FDA and has been authorized for detection and/or diagnosis of SARS-CoV-2 by FDA under an Emergency Use Authorization (EUA). This EUA will remain in effect (meaning this test can be used)  for the duration of the COVID-19 declaration under Section 564(b)(1) of the Act, 21 U.S.C. section 360bbb-3(b)(1), unless the authorization is terminated or revoked.  Performed at [facility], [place] [address] [place], [place] [address]   Urinalysis, Routine w reflex microscopic -Urine, Clean Catch     Status: Abnormal   Collection Time: 06/02/22 11:25 PM  Result Value Ref Range   Color, Urine YELLOW YELLOW   APPearance CLEAR CLEAR   Specific Gravity, Urine 1.016 1.005 - 1.030   pH 6.0 5.0 - 8.0  Glucose, UA NEGATIVE NEGATIVE mg/dL   Hgb urine dipstick NEGATIVE NEGATIVE   Bilirubin Urine NEGATIVE NEGATIVE   Ketones, ur 5 (A) NEGATIVE mg/dL   Protein, ur NEGATIVE NEGATIVE mg/dL   Nitrite NEGATIVE NEGATIVE   Leukocytes,Ua NEGATIVE NEGATIVE    Comment: Performed at [facility] [address] [place], [place] [address]  Comprehensive metabolic panel     Status: Abnormal   Collection Time: 06/03/22  4:20 AM  Result Value Ref Range   Sodium 138 135 - 145 mmol/L   Potassium 4.1 3.5 - 5.1 mmol/L    Comment: HEMOLYSIS AT THIS LEVEL MAY AFFECT RESULT HEMOLYSIS AT THIS LEVEL MAY AFFECT RESULT    Chloride 105 98 - 111 mmol/L   CO2 24 22 - 32 mmol/L   Glucose, Bld 114 (H) 70 - 99 mg/dL    Comment: Glucose reference range applies only to samples taken after fasting for at least 8 hours.   BUN 8 8 - 23 mg/dL   Creatinine, Ser 0.67 0.44 - 1.00 mg/dL   Calcium 8.7 (L) 8.9 - 10.3 mg/dL   Total Protein 6.2 (L) 6.5 - 8.1 g/dL   Albumin 3.5 3.5 - 5.0 g/dL   AST 65 (H) 15 - 41 U/L    Comment: HEMOLYSIS AT THIS LEVEL MAY AFFECT RESULT HEMOLYSIS AT THIS LEVEL MAY AFFECT RESULT    ALT 31 0 - 44 U/L    Comment: HEMOLYSIS AT THIS LEVEL MAY AFFECT RESULT HEMOLYSIS AT THIS LEVEL MAY AFFECT RESULT    Alkaline Phosphatase 39 38 - 126 U/L   Total Bilirubin 1.3 (H) 0.3 - 1.2 mg/dL    Comment: HEMOLYSIS AT THIS LEVEL MAY AFFECT RESULT HEMOLYSIS AT THIS LEVEL MAY AFFECT RESULT     GFR, Estimated >60 >60 mL/min    Comment: (NOTE) Calculated using the CKD-EPI Creatinine Equation (2021)    Anion gap 9 5 - 15    Comment: Performed at [facility], [place] [address] [place], [place] [address]  TSH     Status: None   Collection Time: 06/03/22  4:20 AM  Result Value Ref Range   TSH 1.076 0.350 - 4.500 uIU/mL    Comment: Performed by a 3rd Generation assay with a functional sensitivity of <=0.01 uIU/mL. Performed at [facility], [place] [address] [place],  [address]   Rapid urine drug screen (hospital performed)     Status: None   Collection Time: 06/03/22  4:20 AM  Result Value Ref Range   Opiates NONE DETECTED NONE DETECTED   Cocaine NONE DETECTED NONE DETECTED   Benzodiazepines NONE DETECTED NONE DETECTED   Amphetamines NONE DETECTED NONE DETECTED   Tetrahydrocannabinol NONE DETECTED NONE DETECTED   Barbiturates NONE DETECTED NONE DETECTED    Comment: (NOTE) DRUG SCREEN FOR MEDICAL PURPOSES ONLY.  IF CONFIRMATION IS NEEDED FOR ANY PURPOSE, NOTIFY LAB WITHIN 5 DAYS.  LOWEST DETECTABLE LIMITS FOR URINE DRUG SCREEN Drug Class                     Cutoff (ng/mL) Amphetamine and metabolites    1000 Barbiturate and metabolites    200 Benzodiazepine                 200 Opiates and metabolites        300 Cocaine and metabolites        300 THC  50 Performed at [facility], [place] [address], [place], [place] [address]   Ammonia     Status: None   Collection Time: 06/03/22  5:46 AM  Result Value Ref Range   Ammonia 26 9 - 35 umol/L    Comment: Performed at [facility], [place] [address], [place], [place] [address]   MR BRAIN WO CONTRAST  Result Date: 06/03/2022 CLINICAL DATA:  82 year old female with altered mental status. EXAM: MRI HEAD WITHOUT CONTRAST TECHNIQUE: Multiplanar, multiecho pulse sequences of the brain and surrounding structures were obtained without intravenous contrast. COMPARISON:  Head CT  yesterday. FINDINGS: Brain: Confluent restricted diffusion throughout the medial right occipital lobe series 5, image 77. Extension anteriorly toward the tail of the right hippocampal complex. And associated nodular, lacunar type ischemia in the dorsal right thalamus (series 5, image 81). T2 and FLAIR hyperintense cytotoxic edema in the affected areas, but only subtle T1 signal changes associated. Trace petechial hemorrhage series 12, image 23. No superimposed anterior or left hemisphere vascular territory diffusion abnormality. No posterior fossa involvement. No midline shift, mass effect, evidence of mass lesion, ventriculomegaly, extra-axial collection or malignant intracranial hemorrhage. Cervicomedullary junction and pituitary are within normal limits. Mild to moderate for age superimposed bilateral cerebral white matter T2 and FLAIR hyperintensity, greater in the left frontal lobe. No chronic cortical encephalomalacia identified. Basal ganglia, brainstem, and cerebellum appear negative. Vascular: Major intracranial vascular flow voids are preserved. Skull and upper cervical spine: Normal for age visible cervical spine. Visualized bone marrow signal is within normal limits. Sinuses/Orbits: Stable, negative. Other: Mastoids are well aerated.  Negative visible scalp and face. IMPRESSION: 1. Acute Right PCA territory infarct. Confluent involvement of the medial occipital lobe. Mild involvement of the posterior right temporal lobe and right thalamus. Trace occipital petechial hemorrhage, but no malignant hemorrhagic transformation or significant mass effect. 2. 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 Electronically Signed   By: Genevie Ann M.D.   On: 06/03/2022 05:38   DG Abd 1 View  Result Date: 06/03/2022 CLINICAL DATA:  82 year old female with abdominal pain. EXAM: ABDOMEN - 1 VIEW COMPARISON:  CT Abdomen and Pelvis 10/12/2019. FINDINGS: Portable AP supine view at 0358  hours. Grossly negative visible lung bases. Non obstructed bowel gas pattern, stable compared to the CT scout view in 2021. Stable abdominal and pelvic visceral contours. No acute osseous abnormality identified. IMPRESSION: Non obstructed bowel gas pattern. Electronically Signed   By: Genevie Ann M.D.   On: 06/03/2022 04:35   CT Head Wo Contrast  Result Date: 06/02/2022 CLINICAL DATA:  Altered mental status EXAM: CT HEAD WITHOUT CONTRAST TECHNIQUE: Contiguous axial images were obtained from the base of the skull through the vertex without intravenous contrast. RADIATION DOSE REDUCTION: This exam was performed according to the departmental dose-optimization program which includes automated exposure control, adjustment of the mA and/or kV according to patient size and/or use of iterative reconstruction technique. COMPARISON:  08/29/2019 FINDINGS: Brain: No evidence of acute infarction, hemorrhage, mass, mass effect, or midline shift. No hydrocephalus or extra-axial fluid collection. Right thalamic lacunar infarct appears chronic but is new compared to 2021. Periventricular white matter changes, likely the sequela of chronic small vessel ischemic disease. Vascular: No hyperdense vessel. Skull: Negative for fracture or focal lesion. Sinuses/Orbits: Mild mucosal thickening in right maxillary sinus. Status post bilateral lens replacements. Other: The mastoid air cells are well aerated. IMPRESSION: No acute intracranial process. Electronically Signed   By: Bryson Ha  Vasan M.D.   On: 06/02/2022 23:01   DG Chest 2 View  Result Date: 06/02/2022 CLINICAL DATA:  Altered mental status EXAM: CHEST - 2 VIEW COMPARISON:  07/08/2012 FINDINGS: Heart is borderline in size. Lungs clear. No effusions or edema. No acute bony abnormality. IMPRESSION: No active cardiopulmonary disease. Electronically Signed   By: Rolm Baptise M.D.   On: 06/02/2022 21:12    Pending Labs Unresulted Labs (From admission, onward)     [place]     Ordered    06/03/22 1133  LDL cholesterol, direct  Add-on,   AD        06/03/22 1133   06/03/22 1133  Hemoglobin A1c  Add-on,   AD        06/03/22 1133   06/03/22 0027  Urine Culture  Once,   URGENT       Question:  Indication  Answer:  Altered mental status (if no other cause identified)   06/03/22 0026   06/03/22 0027  Culture, blood (routine x 2)  BLOOD CULTURE X 2,   R      06/03/22 0026            Vitals/Pain Today's Vitals   06/03/22 0900 06/03/22 1022 06/03/22 1030 06/03/22 1045  BP: 117/88  124/73   Pulse: 61 61  64  Resp:  16 20   Temp:  98.1 F (36.7 C)    TempSrc:  Oral    SpO2: 94% 97% 99%   Weight:      Height:      PainSc:  0-No pain      Isolation Precautions No active isolations  Medications Medications  cefTRIAXone (ROCEPHIN) 1 g in sodium chloride 0.9 % 100 mL IVPB (has no administration in time range)  0.9 %  sodium chloride infusion ( Intravenous New Bag/Given 06/03/22 0416)  acetaminophen (TYLENOL) tablet 650 mg (has no administration in time range)    Or  acetaminophen (TYLENOL) suppository 650 mg (has no administration in time range)  loratadine (CLARITIN) tablet 10 mg (has no administration in time range)  aspirin EC tablet 81 mg (81 mg Oral Given 06/03/22 1215)  clopidogrel (PLAVIX) tablet 75 mg (75 mg Oral Given 06/03/22 1215)   stroke: early stages of recovery book (has no administration in time range)  sodium chloride 0.9 % bolus 500 mL (0 mLs Intravenous Stopped 06/03/22 0224)  loratadine (CLARITIN) tablet 10 mg (10 mg Oral Given 06/03/22 0037)  cefTRIAXone (ROCEPHIN) 1 g in sodium chloride 0.9 % 100 mL IVPB (0 g Intravenous Stopped 06/03/22 0145)    Mobility walks with person assist     Focused Assessments Neuro Assessment Handoff:  Swallow screen pass? Yes    NIH Stroke Scale  Dizziness Present: Yes Headache Present: Yes Interval: Other (Comment) (per order) Level of Consciousness (1a.)   : Alert, keenly responsive LOC Questions (1b. )   :  Answers both questions correctly LOC Commands (1c. )   : Performs both tasks correctly Best Gaze (2. )  : Normal Visual (3. )  : No visual loss Facial Palsy (4. )    : Normal symmetrical movements Motor Arm, Left (5a. )   : No drift Motor Arm, Right (5b. ) : No drift Motor Leg, Left (6a. )  : No drift Motor Leg, Right (6b. ) : No drift Limb Ataxia (7. ): Absent Sensory (8. )  : Normal, no sensory loss Best Language (9. )  : No aphasia Dysarthria (10. ): Normal Extinction/Inattention (  11.)   : No Abnormality Complete NIHSS TOTAL: 0 Last date known well: 06/01/22 Last time known well: 1859 Neuro Assessment: Exceptions to [facility] Neuro Checks:   Shift assessment (06/03/22 1023)  Has TPA been given? No If patient is a Neuro Trauma and patient is going to OR before floor call report to [place] nurse: [phone] or [phone]   R Recommendations: See Admitting Provider Note  Report given to:   Additional Notes: intermittent confusion and forgetfulness. Family plans to stay with pt tonight otherwise request a sitter overnight for fall safety.

## 2022-06-03 NOTE — ED Notes (Signed)
Pt ambulatory to bathroom with minimal assistance

## 2022-06-03 NOTE — ED Provider Notes (Signed)
[facility] Provider Note   CSN: [id number] Arrival date & time: 06/02/22  1858     History  Chief Complaint  Patient presents with   Altered Mental Status    Shamarra Ramone is a 82 y.o. female.  The history is provided by the patient, medical records and a relative.  Altered Mental Status Garnette Mollison is a 82 y.o. female who presents to the Emergency Department complaining of altered mental status.  She presents to the emergency department accompanied by her daughter and additional history is available over the phone via her son.  She is typically healthy and only has B12 deficiency on supplement, osteoarthritis and recurrent stress-induced urticaria.  She was last known well yesterday afternoon.  Last night she had an episode of hallucinations where she believes she was in another house and was very confused.  She was unable to locate her bathroom and went outside and urinated on the deck.  No recent illnesses or injuries.  She is feeling improved at time of ED assessment but has great concern over her period of confusion.  Family states that she is not quite at baseline currently.  No prior similar episodes in the past.  She does live at home with her husband.  No additional in-home support.  No known sick contacts.  She did have acupuncture last Friday and has been trying to lose weight with fasting and consuming flaxseeds.  She sees Dr[facility] Aggie Mo[facility] and a nurse from their staff evaluated her in the home and she was found to have a temperature of 99.4 with incontinence and family have concern for UTI.  Patient complains of abdominal pain, this has been a chronic issue for her.  No nausea, vomiting, diarrhea.  She does report decreased stool caliber.   Home Medications Prior to Admission medications   Medication Sig Start Date End Date Taking? Authorizing Provider  Cyanocobalamin (VITAMIN DEFICIENCY SYSTEM-B12) 1000  MCG/ML KIT Inject 1,000 mcg as directed every 30 (thirty) days.   Yes [provider]  loratadine (CLARITIN) 10 MG tablet Take 10 mg by mouth daily.   Yes [provider]      Allergies    Other    Review of Systems   Review of Systems  All other systems reviewed and are negative.   Physical Exam Updated Vital Signs BP 136/66 (BP Location: Right Arm)   Pulse 68   Temp 98.2 F (36.8 C) (Oral)   Resp 17   Ht '4\' 4"'$  (1.321 m)   Wt 75.3 kg   SpO2 94%   BMI 43.16 kg/m  Physical Exam Vitals and nursing note reviewed.  Constitutional:      Appearance: She is well-developed.  HENT:     Head: Normocephalic and atraumatic.  Cardiovascular:     Rate and Rhythm: Normal rate and regular rhythm.     Heart sounds: No murmur heard. Pulmonary:     Effort: Pulmonary effort is normal. No respiratory distress.     Breath sounds: Normal breath sounds.  Abdominal:     Palpations: Abdomen is soft.     Tenderness: There is no abdominal tenderness. There is no guarding or rebound.  Musculoskeletal:        General: No tenderness.  Skin:    General: Skin is warm and dry.     Comments: Urticaria involving the trunk, bilateral upper extremities, neck  Neurological:     Mental Status: She is  alert and oriented to person, place, and time.     Comments: No asymmetry of facial movements, visual fields grossly intact.  5 out of 5 strength in all 4 extremities with sensation to light touch intact in all 4 extremities.  No pronator drift.  Psychiatric:        Behavior: Behavior normal.     ED Results / Procedures / Treatments   Labs (all labs ordered are listed, but only abnormal results are displayed) Labs Reviewed  COMPREHENSIVE METABOLIC PANEL - Abnormal; Notable for the following components:      Result Value   CO2 20 (*)    Glucose, Bld 115 (*)    AST 54 (*)    All other components within normal limits  URINALYSIS, ROUTINE W REFLEX MICROSCOPIC - Abnormal; Notable for  the following components:   Ketones, ur 5 (*)    All other components within normal limits  RESP PANEL BY RT-PCR (RSV, FLU A&B, COVID)  [id number]  URINE CULTURE  CULTURE, BLOOD (ROUTINE X 2)  CULTURE, BLOOD (ROUTINE X 2)  CBC WITH DIFFERENTIAL/PLATELET  COMPREHENSIVE METABOLIC PANEL  TSH  AMMONIA  RAPID URINE DRUG SCREEN, HOSP PERFORMED    EKG EKG Interpretation  Date/Time:  Tuesday June 02 2022 20:33:20 EST Ventricular Rate:  65 PR Interval:  148 QRS Duration: 80 QT Interval:  410 QTC Calculation: 426 R Axis:   6 Text Interpretation: Sinus rhythm with occasional Premature ventricular complexes Otherwise normal ECG Confirmed by Quintella Reichert [phone]) on 06/03/2022 12:25:09 AM  Radiology CT Head Wo Contrast  Result Date: 06/02/2022 CLINICAL DATA:  Altered mental status EXAM: CT HEAD WITHOUT CONTRAST TECHNIQUE: Contiguous axial images were obtained from the base of the skull through the vertex without intravenous contrast. RADIATION DOSE REDUCTION: This exam was performed according to the departmental dose-optimization program which includes automated exposure control, adjustment of the mA and/or kV according to patient size and/or use of iterative reconstruction technique. COMPARISON:  08/29/2019 FINDINGS: Brain: No evidence of acute infarction, hemorrhage, mass, mass effect, or midline shift. No hydrocephalus or extra-axial fluid collection. Right thalamic lacunar infarct appears chronic but is new compared to 2021. Periventricular white matter changes, likely the sequela of chronic small vessel ischemic disease. Vascular: No hyperdense vessel. Skull: Negative for fracture or focal lesion. Sinuses/Orbits: Mild mucosal thickening in right maxillary sinus. Status post bilateral lens replacements. Other: The mastoid air cells are well aerated. IMPRESSION: No acute intracranial process. Electronically Signed   By: Merilyn Baba M.D.   On: 06/02/2022 23:01   DG Chest 2 View  Result Date:  06/02/2022 CLINICAL DATA:  Altered mental status EXAM: CHEST - 2 VIEW COMPARISON:  07/08/2012 FINDINGS: Heart is borderline in size. Lungs clear. No effusions or edema. No acute bony abnormality. IMPRESSION: No active cardiopulmonary disease. Electronically Signed   By: Rolm Baptise M.D.   On: 06/02/2022 21:12    Procedures Procedures    Medications Ordered in ED Medications  cefTRIAXone (ROCEPHIN) 1 g in sodium chloride 0.9 % 100 mL IVPB (has no administration in time range)  0.9 %  sodium chloride infusion (has no administration in time range)  acetaminophen (TYLENOL) tablet 650 mg (has no administration in time range)    Or  acetaminophen (TYLENOL) suppository 650 mg (has no administration in time range)  loratadine (CLARITIN) tablet 10 mg (has no administration in time range)  sodium chloride 0.9 % bolus 500 mL (0 mLs Intravenous Stopped 06/03/22 0224)  loratadine (CLARITIN) tablet 10 mg (10  mg Oral Given 06/03/22 0037)  cefTRIAXone (ROCEPHIN) 1 g in sodium chloride 0.9 % 100 mL IVPB (0 g Intravenous Stopped 06/03/22 0145)    ED Course/ Medical Decision Making/ A&P                             Medical Decision Making Amount and/or Complexity of Data Reviewed Labs: ordered.  Risk OTC drugs. Decision regarding hospitalization.   Patient without significant medical history here for evaluation of altered mental status with hallucinations and confusion that started abruptly last night.  She has no focal neurologic deficits on examination but does have some scattered urticaria, history of recurrent urticaria in the past.  Will treat empirically with antibiotics for possible UTI despite negative UA and will send a urine culture.  Will treat with IV fluid hydration.  Medicine consulted for admission for ongoing care and workup.        Final Clinical Impression(s) / ED Diagnoses Final diagnoses:  Delirium    Rx / DC Orders ED Discharge Orders     None         Quintella Reichert,  MD 06/03/22 [phone]

## 2022-06-03 NOTE — TOC Initial Note (Signed)
Transition of Care [facility]) - Initial/Assessment Note    Patient Details  Name: Natalie Mcfarland MRN: [id number] Date of Birth: March 23, 1941  [facility] CM/SW Contact:    Pollie Friar, RN Phone Number: 06/03/2022, 3:41 PM  Clinical Narrative:                 Pt is from home with her spouse. She says they are together all the time. Pt states she does some driving but her spouse can drive as needed.  Pt states she uses a walker at home sometimes.  She manages her own medications and denies any issues. [facility] choice [facility] [address].  Currently outpatient therapy recommended. Spouse has taken the information for outpatient therapy and asked to talk to his family about where for her to attend. CM will follow up with them tomorrow.   Expected Discharge Plan: OP Rehab Barriers to Discharge: Continued Medical Work up   Patient Goals and CMS Choice   CMS Medicare.gov Compare Post Acute Care list provided to:: Patient Represenative (must comment) Choice offered to / list presented to : Patient, Spouse, Adult Children      Expected Discharge Plan and Services   Discharge Planning Services: CM Consult   Living arrangements for the past 2 months: Single Family Home                                      Prior Living Arrangements/Services Living arrangements for the past 2 months: Single Family Home Lives with:: Spouse Patient language and need for interpreter reviewed:: Yes Do you feel safe going back to the place where you live?: Yes        Care giver support system in place?: No (comment)   Criminal Activity/Legal Involvement Pertinent to Current Situation/Hospitalization: No - Comment as needed  Activities of Daily Living      Permission Sought/Granted                  Emotional Assessment Appearance:: Appears stated age Attitude/Demeanor/Rapport: Engaged Affect (typically observed): Accepting Orientation: : Oriented to Self, Oriented  to Place, Oriented to Situation   Psych Involvement: No (comment)  Admission diagnosis:  Delirium [id number] Acute metabolic encephalopathy [id number] Patient Active Problem List   Diagnosis Date Noted   Acute encephalopathy 06/03/2022   Abdominal discomfort 06/03/2022   Urticaria 06/03/2022   Normal anion gap metabolic acidosis [id number]   At high risk for falls 06/03/2022   Asthma 09/23/2021   GERD (gastroesophageal reflux disease) 09/23/2021   Dermatitis 09/23/2021   Vitamin D deficiency 09/23/2021   Osteoporosis 09/23/2021   Dry eye 09/23/2021   Low back pain 08/08/2021   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

## 2022-06-03 NOTE — ED Notes (Signed)
Bladder scan post-void 22m

## 2022-06-03 NOTE — ED Notes (Signed)
Pt c/o pain to IV in right forearm. No redness, swelling or drainage noted. Fluids infusing with no complications. Dressing changed and gauze applied under dressing for comfort. Pt reports improved comfort to IV site

## 2022-06-03 NOTE — ED Notes (Signed)
Pt declines changing into a gown at this time

## 2022-06-03 NOTE — ED Notes (Signed)
Patient transported to CT 

## 2022-06-03 NOTE — ED Notes (Signed)
Neurologist at bedside. 

## 2022-06-03 NOTE — Evaluation (Signed)
Physical Therapy Evaluation Patient Details Name: Natalie Mcfarland MRN: [id number] DOB: 06/28/1940 Today's Date: 06/03/2022  History of Present Illness  82 y.o. female presents to [facility] hospital on 06/02/2022 with AMS. Imaging and UA negative. MRI shows R PCA CVA. PMH includes OA, gastritis, GERD.  Clinical Impression  Pt presents to PT with deficits in cognition, awareness, gait, power, balance, endurance. Pt is perseverative during session and demonstrates poor memory and awareness, often repeating information already provided to this PT. Pt reports L hip instability and imbalance but does not seem accepting of PT education of the benefits of PT evaluation and treatment this early after CVA. Pt is able to ambulate with use of small based quad cane for short household distances. Pt reports significant from fatigue due to CVA as well as events of this hospital stay. PT will attempt to follow up for further assessment and treatment during this admission. PT recommends outpatient PT at this time.       Recommendations for follow up therapy are one component of a multi-disciplinary discharge planning process, led by the attending physician.  Recommendations may be updated based on patient status, additional functional criteria and insurance authorization.  Follow Up Recommendations Outpatient PT      Assistance Recommended at Discharge Frequent or constant Supervision/Assistance  Patient can return home with the following  A little help with bathing/dressing/bathroom;Assistance with cooking/housework;Direct supervision/assist for medications management;Direct supervision/assist for financial management;Assist for transportation;Help with stairs or ramp for entrance    Equipment Recommendations None recommended by PT  Recommendations for Other Services       Functional Status Assessment Patient has had a recent decline in their functional status and demonstrates the ability to make significant  improvements in function in a reasonable and predictable amount of time.     Precautions / Restrictions Precautions Precautions: Fall Restrictions Weight Bearing Restrictions: No      Mobility  Bed Mobility Overal bed mobility: Modified Independent             General bed mobility comments: HOB elevated    Transfers Overall transfer level: Needs assistance Equipment used: Quad cane Transfers: Sit to/from Stand Sit to Stand: Supervision                Ambulation/Gait Ambulation/Gait assistance: Supervision Gait Distance (Feet): 60 Feet Assistive device: Quad cane Gait Pattern/deviations: Step-through pattern Gait velocity: reduced Gait velocity interpretation: <1.8 ft/sec, indicate of risk for recurrent falls   General Gait Details: slowed step-through gait, reduced gait speed, no significant balance deviations noted. PT unable to provide dynamic challenges as pt reporting fatigue and declining further evaluation at this time  Stairs            Wheelchair Mobility    Modified Rankin (Stroke Patients Only)       Balance Overall balance assessment: Needs assistance Sitting-balance support: No upper extremity supported, Feet supported Sitting balance-Leahy Scale: Good     Standing balance support: Single extremity supported, Reliant on assistive device for balance Standing balance-Leahy Scale: Poor                               Pertinent Vitals/Pain Pain Assessment Pain Assessment: Faces Faces Pain Scale: Hurts little more Pain Location: head, back Pain Descriptors / Indicators: Aching Pain Intervention(s): Monitored during session    Home Living Family/patient expects to be discharged to:: Private residence Living Arrangements: Spouse/significant other Available Help at Discharge: Available 24 hours/day;Family  Type of Home: House Home Access: Stairs to enter Entrance Stairs-Rails: Chemical engineer of Steps:  2   Home Layout: Two level;Full bath on main level;Able to live on main level with bedroom/bathroom;Other (Comment) (spouse sleeps upstairs) Home Equipment: Cane - quad [employer]) Additional Comments: Takes showers at the [facility] or uses the lower level bathroom for showering.    Prior Function Prior Level of Function : Independent/Modified Independent             Mobility Comments: Ambulates with small based quad cane for community mobility. Goes to the [facility] frequently with her spouse       Hand Dominance   Dominant Hand: Right    Extremity/Trunk Assessment   Upper Extremity Assessment Upper Extremity Assessment: Defer to OT evaluation    Lower Extremity Assessment Lower Extremity Assessment: Difficult to assess due to impaired cognition;LLE deficits/detail LLE Deficits / Details: at least 4-/5 based on observed mobility. Pt reports L hip instability and imbalance. Difficult to determine if this is acute of chronic.    Cervical / Trunk Assessment Cervical / Trunk Assessment: Normal  Communication   Communication: No difficulties  Cognition Arousal/Alertness: Awake/alert Behavior During Therapy: WFL for tasks assessed/performed Overall Cognitive Status: Impaired/Different from baseline Area of Impairment: Memory, Safety/judgement, Awareness                     Memory: Decreased short-term memory   Safety/Judgement: Decreased awareness of safety, Decreased awareness of deficits Awareness: Intellectual   General Comments: perseverative on seeing her acupunturist, mentions this 4 times during brief session and seems to forget she has already made this PT aware of her visits        General Comments General comments (skin integrity, edema, etc.): VSS on RA    Exercises     Assessment/Plan    PT Assessment Patient needs continued PT services  PT Problem List Decreased strength;Decreased activity tolerance;Decreased balance;Decreased mobility;Decreased  cognition;Decreased knowledge of use of DME;Decreased safety awareness;Decreased knowledge of precautions;Pain       PT Treatment Interventions DME instruction;Gait training;Functional mobility training;Stair training;Therapeutic activities;Therapeutic exercise;Balance training;Neuromuscular re-education;Patient/family education;Cognitive remediation    PT Goals (Current goals can be found in the Care Plan section)  Acute Rehab PT Goals Patient Stated Goal: to return to independence, reduce back pain PT Goal Formulation: With patient Time For Goal Achievement: 06/17/22 Potential to Achieve Goals: Fair    Frequency Min 4X/week     Co-evaluation               AM-PAC PT "6 Clicks" Mobility  Outcome Measure Help needed turning from your back to your side while in a flat bed without using bedrails?: None Help needed moving from lying on your back to sitting on the side of a flat bed without using bedrails?: None Help needed moving to and from a bed to a chair (including a wheelchair)?: A Little Help needed standing up from a chair using your arms (e.g., wheelchair or bedside chair)?: A Little Help needed to walk in hospital room?: A Little Help needed climbing 3-5 steps with a railing? : A Little 6 Click Score: 20    End of Session Equipment Utilized During Treatment: Gait belt Activity Tolerance: Patient tolerated treatment well Patient left: in bed;with call bell/phone within reach;with family/visitor present Nurse Communication: Mobility status PT Visit Diagnosis: Other abnormalities of gait and mobility (R26.89);Other symptoms and signs involving the nervous system (R29.898);Muscle weakness (generalized) (M62.81)    Time: [id number] PT  Time Calculation (min) (ACUTE ONLY): 13 min   Charges:   PT Evaluation $PT Eval Low Complexity: 1 Low          Zenaida Niece, PT, DPT Acute Rehabilitation Office [place] 06/03/2022, 1:02 PM

## 2022-06-03 NOTE — Evaluation (Signed)
Occupational Therapy Evaluation Patient Details Name: Natalie Mcfarland MRN: [id number] DOB: February 14, 1941 Today's Date: 06/03/2022   History of Present Illness Doristine Liceaga is a 82 y.o. female admitted with some confusion.  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   Clinical Impression   Mrs. Kalafut currently presents at an overall min to min guard assist for simulated selfcare tasks and functional transfers without use of an assistive device.  LOB to the left noted on two occasions during mobilization in the room and down the hall.  She did exhibit decreased short term memory with 0/3 words recalled after distraction and 2 min delay.  She was oriented to place, time, and situation.  Some delay with processing and following simple one to two step commands such as scoot forward and sit (she instead scooted up and then stood, needing cueing to sit) Possible left visual field cut noted with neurology in to test.  Will further assess in treatment on how impacts her functionally and how well she can compensate.  Recommend acute OT services at this time to help increase overall ADL independence in order to return home with her spouse and 24 hour supervision.        Recommendations for follow up therapy are one component of a multi-disciplinary discharge planning process, led by the attending physician.  Recommendations may be updated based on patient status, additional functional criteria and insurance authorization.   Follow Up Recommendations  Outpatient OT     Assistance Recommended at Discharge Frequent or constant Supervision/Assistance  Patient can return home with the following A little help with bathing/dressing/bathroom;Assistance with cooking/housework;Assist for transportation;Direct supervision/assist for medications management;Direct supervision/assist  for financial management    Functional Status Assessment  Patient has had a recent decline in their functional status and demonstrates the ability to make significant improvements in function in a reasonable and predictable amount of time.  Equipment Recommendations  Other (comment) (recommended shower bench, which pt's family will purchase from outside of the hospital)       Precautions / Restrictions Restrictions Weight Bearing Restrictions: No      Mobility Bed Mobility                    Transfers Overall transfer level: Needs assistance Equipment used: None Transfers: Sit to/from Stand, Bed to chair/wheelchair/BSC Sit to Stand: Min guard     Step pivot transfers: Min assist     General transfer comment: Min assist for functional mobility without assistive device.  increased LOB to the left on two occasions.  Balance improved the further she mobilized.      Balance Overall balance assessment: Needs assistance Sitting-balance support: Feet supported Sitting balance-Leahy Scale: Fair Sitting balance - Comments: Pt could maintain static balance in sitting but was limited by pain.   Standing balance support: During functional activity Standing balance-Leahy Scale: Fair Standing balance comment: Pt able to stand statically with min guard but needed min assist with dynamic tasks.                           ADL either performed or assessed with clinical judgement   ADL Overall ADL's : Needs assistance/impaired Eating/Feeding: Independent;Sitting Eating/Feeding Details (indicate cue type and reason): simulated Grooming: Standing;Wash/dry hands;Supervision/safety Grooming Details (indicate cue type and reason): simulated Upper Body Bathing: Set up;Sitting Upper Body  Bathing Details (indicate cue type and reason): simulated Lower Body Bathing: Min guard;Sit to/from stand Lower Body Bathing Details (indicate cue type and reason): simulation     Lower  Body Dressing: Minimal assistance Lower Body Dressing Details (indicate cue type and reason): simulated.  Pt able to tie her shoe on the left foot by reaching down to the floor. Toilet Transfer: Minimal assistance;Ambulation;Regular Glass blower/designer Details (indicate cue type and reason): simulated without use of cane Toileting- Clothing Manipulation and Hygiene: Min guard;Sit to/from stand Toileting - Clothing Manipulation Details (indicate cue type and reason): simulated     Functional mobility during ADLs: Minimal assistance (no assistive device.) General ADL Comments: Pt's daughter in at start of session, discussed need for a shower bench for safety at home if pt choses to take one there instead of the [facility] secondary to current fall risk. Slight decreased balance noted with functional mobility as well as some difficulty following directions.  When told to scoot out to the edge of the recliner and sit up without support, she scooted out and then stood.  Once she sat down, she needed mod demonstrational cueing to try and stay sitting up unsupported.  Eventually, she requested to move to a diffferent chair for more back support secondary to increased pain.     Vision Baseline Vision/History: 1 Wears glasses Ability to See in Adequate Light: 0 Adequate Patient Visual Report: Other (comment) (increased difficulty with following her finger when reading or trying to write.) Vision Assessment?: Vision impaired- to be further tested in functional context Additional Comments: Neurologist in before vision testing completed.  Per her testing pt presents with likely left visual field deficit. Pt with increased difficulty following directions for keeping eyes forward and not cheating to look to the side fingers were being held up.  Will continue to assess further in treatment     Perception  To be further assessed in function   Praxis  WFLs    Pertinent Vitals/Pain Pain Assessment Pain  Assessment: Faces Faces Pain Scale: Hurts a little bit Pain Location: neck/head Pain Descriptors / Indicators: Aching, Discomfort Pain Intervention(s): Limited activity within patient's tolerance, Repositioned     Hand Dominance Right   Extremity/Trunk Assessment Upper Extremity Assessment Upper Extremity Assessment: Generalized weakness (strength 3+/5 throughout)   Lower Extremity Assessment Lower Extremity Assessment: Defer to PT evaluation   Cervical / Trunk Assessment Cervical / Trunk Assessment: Normal   Communication Communication Communication: No difficulties   Cognition Arousal/Alertness: Awake/alert Behavior During Therapy: WFL for tasks assessed/performed Overall Cognitive Status: Impaired/Different from baseline Area of Impairment: Memory, Safety/judgement, Awareness                     Memory: Decreased short-term memory   Safety/Judgement: Decreased awareness of safety, Decreased awareness of deficits Awareness: Intellectual   General Comments: Pt 0/3 on word recall after approximately 2 min delay with distraction from conversation.  At times, pt needing mod demonstrational cueing to follow simple command like "Can you tie your shoe?" and therapist pointing .  Instead of tying pt talked about having back trouble and propping foot to put her shoe on all the way to just sliding her foot in.  She needed multiple cues to finally complete the task.                Home Living Family/patient expects to be discharged to:: Private residence Living Arrangements: Spouse/significant other Available Help at Discharge: Available 24 hours/day;Family Type of Home: House  Home Access: Stairs to enter Entrance Stairs-Number of Steps: 2 Entrance Stairs-Rails: Left;Right Home Layout: Two level;Full bath on main level;Able to live on main level with bedroom/bathroom;Other (Comment) (spouse's bedroom is on the second level)         Biochemist, clinical: Standard      Home Equipment: Cane - quad   Additional Comments: Takes showers at the [facility] or uses the lower level bathroom for showering.      Prior Functioning/Environment Prior Level of Function : Independent/Modified Independent             Mobility Comments: Reports using the cane for outside and nothing inside          OT Problem List: Decreased strength;Impaired balance (sitting and/or standing);Pain;Decreased safety awareness;Decreased cognition;Decreased knowledge of use of DME or AE      OT Treatment/Interventions: Self-care/ADL training;Patient/family education;Balance training;Neuromuscular education;Therapeutic activities;DME and/or AE instruction;Cognitive remediation/compensation    OT Goals(Current goals can be found in the care plan section) Acute Rehab OT Goals Patient Stated Goal: Pt did not state during session OT Goal Formulation: With patient Time For Goal Achievement: 06/17/22 Potential to Achieve Goals: Good  OT Frequency: Min 2X/week       AM-PAC OT "6 Clicks" Daily Activity     Outcome Measure Help from another person eating meals?: None Help from another person taking care of personal grooming?: A Little Help from another person toileting, which includes using toliet, bedpan, or urinal?: A Little Help from another person bathing (including washing, rinsing, drying)?: A Little Help from another person to put on and taking off regular upper body clothing?: A Little Help from another person to put on and taking off regular lower body clothing?: A Little 6 Click Score: 19   End of Session Equipment Utilized During Treatment: Gait belt Nurse Communication: Mobility status  Activity Tolerance: Patient tolerated treatment well Patient left: in bed;with family/visitor present;Other (comment) (neurologist in the room completing assessment)  OT Visit Diagnosis: Unsteadiness on feet (R26.81);Other abnormalities of gait and mobility (R26.89);Pain;Muscle weakness  (generalized) (M62.81) Pain - part of body:  (head, back, and neck pain)                Time: [id number] OT Time Calculation (min): 48 min Charges:  OT General Charges $OT Visit: 1 Visit OT Evaluation $OT Eval Moderate Complexity: 1 Mod OT Treatments $Self Care/Home Management : 23-37 mins   Clyda Greener, OTR/L [place]  Office [phone] 06/03/2022

## 2022-06-03 NOTE — H&P (Signed)
[facility] H&P 06/03/22   Subjective: Natalie Mcfarland is a 82 y.o. female who presents for evaluation of AMS.  Onset was 2 day ago, with improving course since that time. Patient describes the episode as  confused about where she is. Not recognizing people or her house . Patient also has associated symptoms of  abdominal pain and nausea. The patient denies diarrhea, excessive thirst, headache, and fever/chills .    No new meds  Has been taking flax seed laxative for the last month.  PT did not eat the entire day of 06/01/22.  PT reports she does pace aroudn her house at night. Family denies sundowning.   Past Medical History:  Diagnosis Date   Acute metabolic encephalopathy [id number]   Arthritis    pain and oa left knee   Gastritis    past hx of gastritis -no problem since   GERD (gastroesophageal reflux disease)    Vitamin B12 deficiency    History reviewed. No pertinent family history. Scheduled Meds:  [START ON 06/04/2022]  stroke: early stages of recovery book   Does not apply Once   aspirin EC  81 mg Oral Daily   clopidogrel  75 mg Oral Daily   Continuous Infusions:  cefTRIAXone (ROCEPHIN)  IV     PRN Meds:acetaminophen **OR** acetaminophen, [START ON 06/04/2022] loratadine  Allergies  Allergen Reactions   Other Rash    Pt had a rash from a pain medicine but doesn't remember which one   Social History   Socioeconomic History   Marital status: Married    Spouse name: Not on file   Number of children: Not on file   Years of education: Not on file   Highest education level: Not on file  Occupational History   Not on file  Tobacco Use   Smoking status: Never   Smokeless tobacco: Never  Substance and Sexual Activity   Alcohol use: No   Drug use: No   Sexual activity: Not on file  Other Topics Concern   Not on file  Social History Narrative   Not on file   Social Determinants of Health   Financial Resource Strain: Not on file  Food  Insecurity: Not on file  Transportation Needs: Not on file  Physical Activity: Not on file  Stress: Not on file  Social Connections: Not on file  Intimate Partner Violence: Not on file    Review of Systems Pertinent items are noted in HPI.  Objective: Vital signs in last 24 hours: Temp:  [98.1 F (36.7 C)-99.1 F (37.3 C)] 98.5 F (36.9 C) (03/06 1422) Pulse Rate:  [51-106] 58 (03/06 1422) Resp:  [16-20] 18 (03/06 1422) BP: (112-150)/(66-115) 133/66 (03/06 1422) SpO2:  [91 %-99 %] 99 % (03/06 1422) Weight:  [75.3 kg] 75.3 kg (03/05 1907)  BP 133/66 (BP Location: Left Arm)   Pulse (!) 58   Temp 98.5 F (36.9 C) (Oral)   Resp 18   Ht '4\' 4"'$  (1.321 m)   Wt 75.3 kg   SpO2 99%   BMI 43.16 kg/m   General Appearance:    Alert, cooperative, no distress, appears stated age, weak  Head:    Normocephalic, without obvious abnormality, atraumatic           Throat:   Lips, mucosa, and tongue normal; teeth and gums normal  Neck:   Supple, symmetrical, trachea midline, no adenopathy;    thyroid:  no enlargement/tenderness/nodules; no carotid   bruit or JVD  Back:  Symmetric, no curvature, ROM normal, no CVA tenderness  Lungs:     Clear to auscultation bilaterally, respirations unlabored  Chest Wall:    No tenderness or deformity   Heart:    Regular rate and rhythm, S1 and S2 normal, no murmur, rub   or gallop     Abdomen:     Soft, non-tender, bowel sounds active all four quadrants,    no masses, no organomegaly        Extremities:   Extremities normal, atraumatic, no cyanosis or edema  Pulses:   2+ and symmetric all extremities  Skin:   Skin color, texture, turgor normal, no rashes or lesions     Neurologic:   CNII-XII intact, normal strength, sensation and reflexes    throughout    Cardiographics ECG: no stemi  Assessment/Plan:  Stroke - neuro consulted, recs appreciated - CTA head 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

## 2022-06-03 NOTE — ED Notes (Signed)
Pt reports some dizziness and some blurred vision to left eye.

## 2022-06-04 ENCOUNTER — Other Ambulatory Visit: Payer: Self-pay | Admitting: Cardiology

## 2022-06-04 ENCOUNTER — Observation Stay (HOSPITAL_BASED_OUTPATIENT_CLINIC_OR_DEPARTMENT_OTHER): Payer: Medicare PPO

## 2022-06-04 ENCOUNTER — Observation Stay (HOSPITAL_COMMUNITY)
Admit: 2022-06-04 | Discharge: 2022-06-04 | Disposition: A | Discharge status: Home / Self Care | Payer: Medicare PPO | Attending: Family Medicine | Admitting: Family Medicine

## 2022-06-04 ENCOUNTER — Other Ambulatory Visit: Payer: Medicare PPO

## 2022-06-04 DIAGNOSIS — I639 Cerebral infarction, unspecified: Secondary | ICD-10-CM

## 2022-06-04 DIAGNOSIS — I63431 Cerebral infarction due to embolism of right posterior cerebral artery: Secondary | ICD-10-CM

## 2022-06-04 DIAGNOSIS — I63531 Cerebral infarction due to unspecified occlusion or stenosis of right posterior cerebral artery: Secondary | ICD-10-CM | POA: Insufficient documentation

## 2022-06-04 LAB — COMPREHENSIVE METABOLIC PANEL
ALT: 31 U/L (ref 0–44)
AST: 28 U/L (ref 15–41)
Albumin: 3.5 g/dL (ref 3.5–5.0)
Alkaline Phosphatase: 35 U/L — ABNORMAL LOW (ref 38–126)
Anion gap: 5 (ref 5–15)
BUN: 12 mg/dL (ref 8–23)
CO2: 26 mmol/L (ref 22–32)
Calcium: 8.8 mg/dL — ABNORMAL LOW (ref 8.9–10.3)
Chloride: 106 mmol/L (ref 98–111)
Creatinine, Ser: 0.68 mg/dL (ref 0.44–1.00)
GFR, Estimated: >60 mL/min (ref >60)
Glucose, Bld: 127 mg/dL — ABNORMAL HIGH (ref 70–99)
Potassium: 3.4 mmol/L — ABNORMAL LOW (ref 3.5–5.1)
Sodium: 137 mmol/L (ref 135–145)
Total Bilirubin: 0.9 mg/dL (ref 0.3–1.2)
Total Protein: 6.7 g/dL (ref 6.5–8.1)

## 2022-06-04 LAB — PHOSPHORUS: Phosphorus: 2.8 mg/dL (ref 2.5–4.6)

## 2022-06-04 LAB — URINE CULTURE: Culture: NO GROWTH

## 2022-06-04 LAB — ECHOCARDIOGRAM COMPLETE BUBBLE STUDY
Area-P 1/2: 4.54 cm2
S' Lateral: 3.4 cm

## 2022-06-04 LAB — MAGNESIUM: Magnesium: 2 mg/dL (ref 1.7–2.4)

## 2022-06-04 MED ORDER — ASPIRIN 81 MG PO TBEC: 81.0000 mg | DELAYED_RELEASE_TABLET | Freq: Every day | ORAL | 12 refills | Status: AC

## 2022-06-04 MED ORDER — ROSUVASTATIN CALCIUM 20 MG PO TABS: 20.0000 mg | ORAL_TABLET | Freq: Every day | ORAL | 1 refills | Status: AC

## 2022-06-04 MED ORDER — CLOPIDOGREL BISULFATE 75 MG PO TABS: 75.0000 mg | ORAL_TABLET | Freq: Every day | ORAL | 1 refills | Status: AC

## 2022-06-04 MED ORDER — ROSUVASTATIN CALCIUM 20 MG PO TABS
20.0000 mg | ORAL_TABLET | Freq: Every day | ORAL | Status: DC
  Administered 2022-06-04: 20 mg via ORAL
  Filled 2022-06-04: qty 1

## 2022-06-04 NOTE — Care Management Obs Status (Signed)
[place] NOTIFICATION   Patient Details  Name: Natalie Mcfarland MRN: [id number] Date of Birth: 02/20/1941   Medicare Observation Status Notification Given:  Yes    Pollie Friar, RN 06/04/2022, 11:23 AM

## 2022-06-04 NOTE — TOC Transition Note (Signed)
Transition of Care [facility]) - CM/SW Discharge Note   Patient Details  Name: Natalie Mcfarland MRN: [id number] Date of Birth: 1941/03/21  [facility] CM/SW Contact:  Pollie Friar, RN Phone Number: 06/04/2022, 12:59 PM   Clinical Narrative:    Pt is discharging home with family. She will have the supervision and assistance at home that's recommended.  Outpatient therapy arranged through [facility]. Information on the AVS.  Pt has transportation home.  Final next level of care: OP Rehab Barriers to Discharge: No Barriers Identified   Patient Goals and CMS Choice CMS Medicare.gov Compare Post Acute Care list provided to:: Patient Represenative (must comment) Choice offered to / list presented to : Adult Children  Discharge Placement                         Discharge Plan and Services Additional resources added to the After Visit Summary for     Discharge Planning Services: CM Consult                                 Social Determinants of Health (SDOH) Interventions SDOH Screenings   Tobacco Use: Low Risk  (06/03/2022)     Readmission Risk Interventions     No data to display

## 2022-06-04 NOTE — Progress Notes (Signed)
Discharge instructions discussed with patient and son. All questions answered, IV removed. Patient wheeled off of the unit for transport home.  Gwendolyn Grant, RN

## 2022-06-04 NOTE — Discharge Summary (Signed)
Physician Discharge Summary  Patient ID: Natalie Mcfarland MRN: [id number] DOB/AGE: 06-09-1940 82 y.o.  Admit date: 06/02/2022 Discharge date: 06/04/2022  Admission Diagnoses: AMS  Discharge Diagnoses:  Principal Problem:   Acute right PCA stroke [facility]) Active Problems:   Acute encephalopathy   Abdominal discomfort   Urticaria   Normal anion gap metabolic acidosis   At high risk for falls   Discharged Condition: good  Hospital Course: Patient admitted to the hospital for workup of altered mental status.  Initial head CT was negative.  Follow-up MRI showed acute right PCA stroke.  Stroke neuro was consulted.  They recommended standard stroke workup with lipid panel, A1c, permissive hypertension, IV fluids and follow imaging with CTA.  This was completed and CTA did not show anything that could be intervened on.  Stroke neuro spoke to patient and family.  Decision by family made to have patient get an outpatient Zio patch placed.  She will follow-up with neuro rehab, Occupational Therapy, physical therapy and speech.  Was very anxious to leave the hospital during her entire visit.  Workup for possible infection was negative.  She was sent home on no antibiotics.  Deficit from stroke at d/c seems to be primarily a L upper quadrantanopia.  Pt has driving restriction. Clearance with OT in 4-6 weeks.  Consults: Neurology  Significant Diagnostic Studies:   -CTA head neck shows P4 occlusion. - Echo is pending - UA U/S. Doppler final results bilateral negative -CT head without contrast negative  MRI Brain IMPRESSION: 1. Acute Right PCA territory infarct. Confluent involvement of the medial occipital lobe. Mild involvement of the posterior right temporal lobe and right thalamus. Trace occipital petechial hemorrhage, but no malignant hemorrhagic transformation or significant mass effect.   2. 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      Electronically Signed   By: Genevie Ann M.D.   On: 06/03/2022 05:38   Treatments:   Assessment given while patient was inpatient, to cover for possible UTI.  Urine culture still in process.  Cultures x 2 were negative x 1 day with no growth.  Patient's 4 Plex viral panel was negative.  Did on DAPT for new PCA stroke on the right.  Discharge Exam: Blood pressure 131/80, pulse (!) 57, temperature 98.2 F (36.8 C), temperature source Oral, resp. rate 18, height '4\' 4"'$  (1.321 m), weight 75.3 kg, SpO2 97 %.  Physical Exam Vitals and nursing note reviewed.  Constitutional:      Appearance: Normal appearance. She is ill-appearing.  HENT:     Head: Normocephalic and atraumatic.     Right Ear: External ear normal.     Left Ear: External ear normal.     Nose: Nose normal.     Mouth/Throat:     Mouth: Mucous membranes are moist.  Eyes:     Extraocular Movements: Extraocular movements intact.     Pupils: Pupils are equal, round, and reactive to light.  Cardiovascular:     Rate and Rhythm: Normal rate and regular rhythm.     Pulses: Normal pulses.     Heart sounds: Normal heart sounds.  Pulmonary:     Effort: Pulmonary effort is normal.     Breath sounds: Normal breath sounds.  Abdominal:     General: Abdomen is flat. Bowel sounds are normal. There is no distension.     Palpations: Abdomen is soft.     Tenderness: There is no abdominal tenderness.  Musculoskeletal:  Cervical back: No tenderness.     Right lower leg: No edema.     Left lower leg: No edema.  Skin:    General: Skin is warm and dry.     Capillary Refill: Capillary refill takes less than 2 seconds.     Coloration: Skin is not jaundiced.  Neurological:     General: No focal deficit present.     Mental Status: She is alert and oriented to person, place, and time.     Cranial Nerves: No cranial nerve deficit.  Psychiatric:        Mood and Affect: Mood normal.        Behavior: Behavior normal.     Disposition:  Discharge disposition: 01-Home or Self Care       Discharge Instructions     Ambulatory referral to Occupational Therapy   Complete by: As directed    Ambulatory referral to Physical Therapy   Complete by: As directed       Allergies as of 06/04/2022       Reactions   Other Rash   Pt had a rash from a pain medicine but doesn't remember which one        Medication List     TAKE these medications    aspirin EC 81 MG tablet Take 1 tablet (81 mg total) by mouth daily. Swallow whole. Start taking on: June 05, 2022   clopidogrel 75 MG tablet Commonly known as: PLAVIX Take 1 tablet (75 mg total) by mouth daily. Start taking on: June 05, 2022   loratadine 10 MG tablet Commonly known as: CLARITIN Take 10 mg by mouth daily.   rosuvastatin 20 MG tablet Commonly known as: CRESTOR Take 1 tablet (20 mg total) by mouth daily. Start taking on: June 05, 2022   Vitamin Deficiency System-B12 1000 MCG/ML Kit Generic drug: Cyanocobalamin Inject 1,000 mcg as directed every 30 (thirty) days.        Follow-up [place]. Schedule an appointment as soon as possible for a visit in 1 week(s).   Specialty: Rehabilitation Contact information: [address] [place] [id number] [place] [phone]                Signed: Elwin Mocha 06/04/2022, 12:12 PM

## 2022-06-04 NOTE — Evaluation (Signed)
Speech Language Pathology Evaluation Patient Details Name: Natalie Mcfarland MRN: [id number] DOB: Feb 24, 1941 Today's Date: 06/04/2022 Time: [id number] SLP Time Calculation (min) (ACUTE ONLY): 15 min  Problem List:  Patient Active Problem List   Diagnosis Date Noted   Acute encephalopathy 06/03/2022   Abdominal discomfort 06/03/2022   Urticaria 06/03/2022   Normal anion gap metabolic acidosis [id number]   At high risk for falls 06/03/2022   Asthma 09/23/2021   GERD (gastroesophageal reflux disease) 09/23/2021   Dermatitis 09/23/2021   Vitamin D deficiency 09/23/2021   Osteoporosis 09/23/2021   Dry eye 09/23/2021   Low back pain 08/08/2021   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   Impairment of balance 08/21/2019   History of total knee replacement, 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   Seasonal allergic rhinitis 08/16/2015   Postop Hypokalemia 07/28/2012   OA (osteoarthritis) of knee 07/25/2012   Past Medical History:  Past Medical History:  Diagnosis Date   Acute metabolic encephalopathy [id number]   Arthritis    pain and oa left knee   Gastritis    past hx of gastritis -no problem since   GERD (gastroesophageal reflux disease)    Vitamin B12 deficiency    Past Surgical History:  Past Surgical History:  Procedure Laterality Date   ESOPHAGOGASTRODUODENOSCOPY ENDOSCOPY     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   Procedure: TOTAL KNEE ARTHROPLASTY;  Surgeon: Gearlean Alf, MD;  Location: [facility];  Service: Orthopedics;  Laterality: Left;   HPI:  82 year old female admitted with right PCA infarct.   Assessment / Plan / Recommendation Clinical Impression  Cognitive evaluation complete but limited secondary to patient cooperation. Patient is very eager to go home and verbalized "please stop asking me these things." Per son's recommendation/request, evaluation shortened. Patient is oriented x 4, able to follow directions and make needs known. Per son, her cognitive function appears to be  back to baseline. Educated him on possible cognitive deficits s/p right PCA infarct and need for 24 hour assistance following d/c. If cognitive deficits are noted, recommend OP SLP services. No further acute needs indicated at this time. Son in agreement.    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  Recommendations may be updated based on patient status, additional functional criteria and insurance authorization.    Follow Up Recommendations  Outpatient SLP (if needed)                SLP Evaluation Cognition  Overall Cognitive Status:  (see impression statement) Arousal/Alertness: Awake/alert Orientation Level: Oriented X4 Attention: Sustained Sustained Attention: Appears intact       Comprehension       Expression Verbal Expression Overall Verbal Expression: Appears within functional limits for tasks assessed Written Expression Dominant Hand: Right   Oral / Motor  Oral Motor/Sensory Function Overall Oral Motor/Sensory Function: Other (comment) (appears WFL based on basic observation during speech tasks) Motor Speech Overall Motor Speech: Appears within functional limits for tasks assessed           Gabriel Rainwater MA, CCC-SLP  Shea Swalley Meryl 06/04/2022, 11:28 AM

## 2022-06-04 NOTE — Progress Notes (Signed)
OT Cancellation Note  Patient Details Name: Natalie Mcfarland MRN: [id number] DOB: [birth year]   Cancelled Treatment:    Reason Eval/Treat Not Completed: Other (comment).  Talked with pt's son and he requested to hold off on therapy.  Talked with him about deficits seen and recommendations for home such as a tub bench.  Expressed need for initial 24 hr supervision and outpatient services to continue working on visual field deficit compensation and treatment in functional capacity to better determine return to driving.  Also provided driver resource handout for facility in the area if needed, depending on progress.    Clyda Greener, OTR/L Acute Rehabilitation Services  Office [phone] 06/04/2022

## 2022-06-04 NOTE — Progress Notes (Signed)
VASCULAR LAB    Bilateral lower extremity venous duplex has been performed.  See CV proc for preliminary results.   Marylan Glore, RVT 06/04/2022, 9:43 AM

## 2022-06-04 NOTE — Progress Notes (Signed)
STROKE TEAM PROGRESS NOTE   SUBJECTIVE (INTERVAL HISTORY) Her son and Dr. Aggie Moats are at the bedside.  Overall her condition is gradually improving. Her left HH seems improving, able to see left lower quadrant but still has trouble with left upper quadrant. Pt and family not interested to loop recorder but Ok with zio patch.    OBJECTIVE Temp:  [97.4 F (36.3 C)-99.4 F (37.4 C)] 98.2 F (36.8 C) (03/07 1145) Pulse Rate:  [57-70] 57 (03/07 1145) Resp:  [16-18] 18 (03/07 1145) BP: (118-133)/(65-80) 131/80 (03/07 1145) SpO2:  [94 %-99 %] 97 % (03/07 1145)  No results for input(s): "GLUCAP" in the last 168 hours. Recent Labs  Lab 06/02/22 2029 06/03/22 0420  NA 139 138  K 3.8 4.1  CL 105 105  CO2 20* 24  GLUCOSE 115* 114*  BUN 9 8  CREATININE 0.69 0.67  CALCIUM 9.1 8.7*   Recent Labs  Lab 06/02/22 2029 06/03/22 0420  AST 54* 65*  ALT 39 31  ALKPHOS 43 39  BILITOT 0.7 1.3*  PROT 7.0 6.2*  ALBUMIN 3.9 3.5   Recent Labs  Lab 06/02/22 2029  WBC 7.7  NEUTROABS 5.6  HGB 12.3  HCT 36.5  MCV 93.4  PLT 169   No results for input(s): "CKTOTAL", "CKMB", "CKMBINDEX", "TROPONINI" in the last 168 hours. No results for input(s): "LABPROT", "INR" in the last 72 hours. Recent Labs    06/02/22 2325  COLORURINE YELLOW  LABSPEC 1.016  PHURINE 6.0  GLUCOSEU NEGATIVE  HGBUR NEGATIVE  BILIRUBINUR NEGATIVE  KETONESUR 5*  PROTEINUR NEGATIVE  NITRITE NEGATIVE  LEUKOCYTESUR NEGATIVE    No results found for: "CHOL", "TRIG", "HDL", "CHOLHDL", "VLDL", "LDLCALC" Lab Results  Component Value Date   HGBA1C 5.6 06/03/2022      Component Value Date/Time   LABOPIA NONE DETECTED 06/03/2022 0420   COCAINSCRNUR NONE DETECTED 06/03/2022 0420   LABBENZ NONE DETECTED 06/03/2022 0420   AMPHETMU NONE DETECTED 06/03/2022 0420   THCU NONE DETECTED 06/03/2022 0420   LABBARB NONE DETECTED 06/03/2022 0420    No results for input(s): "ETH" in the last 168 hours.  I have personally  reviewed the radiological images below and agree with the radiology interpretations.  VAS [place] LOWER EXTREMITY VENOUS (DVT)  Result Date: 06/04/2022  Lower Venous DVT Study Patient Name:  SEVILLA BEANE  Date of Exam:   06/04/2022 Medical Rec #: [id number]      Accession #:    [id number] Date of Birth: June 12, 1940      Patient Gender: F Patient Age:   82 years Exam Location:  [facility] Procedure:      VAS [place] LOWER EXTREMITY VENOUS (DVT) Referring Phys: Cornelius Moras Breland Trouten --------------------------------------------------------------------------------  Indications: Stroke.  Comparison Study: No prior study on file Performing Technologist: Sharion Dove RVS  Examination Guidelines: A complete evaluation includes B-mode imaging, spectral Doppler, color Doppler, and power Doppler as needed of all accessible portions of each vessel. Bilateral testing is considered an integral part of a complete examination. Limited examinations for reoccurring indications may be performed as noted. The reflux portion of the exam is performed with the patient in reverse Trendelenburg.  +---------+---------------+---------+-----------+----------+--------------+ RIGHT    CompressibilityPhasicitySpontaneityPropertiesThrombus Aging +---------+---------------+---------+-----------+----------+--------------+ CFV      Full           Yes      Yes                                 +---------+---------------+---------+-----------+----------+--------------+  SFJ      Full                                                        +---------+---------------+---------+-----------+----------+--------------+ FV Prox  Full                                                        +---------+---------------+---------+-----------+----------+--------------+ FV Mid   Full           Yes      Yes                                 +---------+---------------+---------+-----------+----------+--------------+ FV DistalFull                                                         +---------+---------------+---------+-----------+----------+--------------+ PFV      Full                                                        +---------+---------------+---------+-----------+----------+--------------+ POP      Full           Yes      Yes                                 +---------+---------------+---------+-----------+----------+--------------+ PTV      Full                                                        +---------+---------------+---------+-----------+----------+--------------+ PERO     Full                                                        +---------+---------------+---------+-----------+----------+--------------+   +---------+---------------+---------+-----------+----------+--------------+ LEFT     CompressibilityPhasicitySpontaneityPropertiesThrombus Aging +---------+---------------+---------+-----------+----------+--------------+ CFV      Full           Yes      Yes                                 +---------+---------------+---------+-----------+----------+--------------+ SFJ      Full                                                        +---------+---------------+---------+-----------+----------+--------------+  FV Prox  Full                                                        +---------+---------------+---------+-----------+----------+--------------+ FV Mid   Full           Yes      Yes                                 +---------+---------------+---------+-----------+----------+--------------+ FV DistalFull                                                        +---------+---------------+---------+-----------+----------+--------------+ PFV      Full                                                        +---------+---------------+---------+-----------+----------+--------------+ POP      Full           Yes      Yes                                  +---------+---------------+---------+-----------+----------+--------------+ PTV      Full                                                        +---------+---------------+---------+-----------+----------+--------------+ PERO     Full                                                        +---------+---------------+---------+-----------+----------+--------------+     Summary: BILATERAL: - No evidence of deep vein thrombosis seen in the lower extremities, bilaterally. - RIGHT: - Cystic structure noted medial knee/popliteal fossa area measuring 1.5 cm X 1.37 cm, etiology unknown  LEFT: - No cystic structure found in the popliteal fossa.  *See table(s) above for measurements and observations.    Preliminary    CT ANGIO HEAD NECK W WO CM  Addendum Date: 06/03/2022   ADDENDUM REPORT: 06/03/2022 13:59 ADDENDUM: Findings discussed with Dr. Quinn Axe via telephone at 1:58 p.m. Electronically Signed   By: Margaretha Sheffield M.D.   On: 06/03/2022 13:59   Result Date: 06/03/2022 CLINICAL DATA:  Neuro deficit, acute, stroke suspected PCA stroke EXAM: CT ANGIOGRAPHY HEAD AND NECK CT ANGIOGRAPHY OF THE HEAD AND NECK CT PERFUSION BRAIN TECHNIQUE: Multidetector CT imaging of the head and neck was performed using the standard protocol during bolus administration of intravenous contrast. Multiplanar CT image reconstructions and MIPs were obtained to evaluate the vascular anatomy. Carotid stenosis measurements (when applicable) are obtained utilizing NASCET criteria, using  the distal internal carotid diameter as the denominator. RADIATION DOSE REDUCTION: This exam was performed according to the departmental dose-optimization program which includes automated exposure control, adjustment of the mA and/or kV according to patient size and/or use of iterative reconstruction technique. CONTRAST:  89m OMNIPAQUE IOHEXOL 350 MG/ML SOLN COMPARISON:  None Available. FINDINGS: CT HEAD FINDINGS Brain: Acute right PCA territory  infarct, better characterized on same day MRI. No mass occupying acute hemorrhage or midline shift. No hydrocephalus. Vascular: No acute abnormality on limited assessment. Skull: No acute fracture. Sinuses/Orbits: Clear sinuses.  No acute orbital findings. Other: No mastoid effusions. Review of the MIP images confirms the above findings CTA NECK FINDINGS Aortic arch: Great vessel origins are patent. Right carotid system: No evidence of dissection, stenosis (50% or greater), or occlusion. Left carotid system: No evidence of dissection, stenosis (50% or greater), or occlusion. Vertebral arteries: Codominant. No evidence of dissection, stenosis (50% or greater), or occlusion. Skeleton: No acute abnormality on limited assessment. Other neck: Subcentimeter nodules in the thyroid, which do not require further imaging follow-up. (Ref: J Am Coll Radiol. 2015 Feb;12(2): 143-50). Upper chest: Visualized lung apices are clear. Review of the MIP images confirms the above findings CTA HEAD FINDINGS Anterior circulation: Bilateral intracranial ICAs, MCAs and ACAs are patent without proximal hemodynamically significant stenosis Posterior circulation: Bilateral intradural vertebral arteries and basilar arteries are patent without significant stenosis. The proximal PCAs are patent. There is occlusion of the distal (P4) right PCA. Left PCA is patent. Venous sinuses: As permitted by contrast timing, patent. Review of the MIP images confirms the above findings IMPRESSION: Occlusion of the distal right PCA (P4 segment). This correlates with known right PCA territory infarct. Electronically Signed: By: FMargaretha SheffieldM.D. On: 06/03/2022 13:48   MR BRAIN WO CONTRAST  Result Date: 06/03/2022 CLINICAL DATA:  [age]year old female with altered mental status. EXAM: MRI HEAD WITHOUT CONTRAST TECHNIQUE: Multiplanar, multiecho pulse sequences of the brain and surrounding structures were obtained without intravenous contrast. COMPARISON:  Head  CT yesterday. FINDINGS: Brain: Confluent restricted diffusion throughout the medial right occipital lobe series 5, image 77. Extension anteriorly toward the tail of the right hippocampal complex. And associated nodular, lacunar type ischemia in the dorsal right thalamus (series 5, image 81). T2 and FLAIR hyperintense cytotoxic edema in the affected areas, but only subtle T1 signal changes associated. Trace petechial hemorrhage series 12, image 23. No superimposed anterior or left hemisphere vascular territory diffusion abnormality. No posterior fossa involvement. No midline shift, mass effect, evidence of mass lesion, ventriculomegaly, extra-axial collection or malignant intracranial hemorrhage. Cervicomedullary junction and pituitary are within normal limits. Mild to moderate for age superimposed bilateral cerebral white matter T2 and FLAIR hyperintensity, greater in the left frontal lobe. No chronic cortical encephalomalacia identified. Basal ganglia, brainstem, and cerebellum appear negative. Vascular: Major intracranial vascular flow voids are preserved. Skull and upper cervical spine: Normal for age visible cervical spine. Visualized bone marrow signal is within normal limits. Sinuses/Orbits: Stable, negative. Other: Mastoids are well aerated.  Negative visible scalp and face. IMPRESSION: 1. Acute Right PCA territory infarct. Confluent involvement of the medial occipital lobe. Mild involvement of the posterior right temporal lobe and right thalamus. Trace occipital petechial hemorrhage, but no malignant hemorrhagic transformation or significant mass effect. 2. 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 Electronically Signed   By: HGenevie AnnM.D.   On: 06/03/2022 05:38   DG Abd 1 View  Result Date: 06/03/2022 CLINICAL DATA:  82 year old female with abdominal pain. EXAM: ABDOMEN - 1 VIEW COMPARISON:  CT Abdomen and Pelvis 10/12/2019. FINDINGS: Portable AP supine view at 0358  hours. Grossly negative visible lung bases. Non obstructed bowel gas pattern, stable compared to the CT scout view in 2021. Stable abdominal and pelvic visceral contours. No acute osseous abnormality identified. IMPRESSION: Non obstructed bowel gas pattern. Electronically Signed   By: Genevie Ann M.D.   On: 06/03/2022 04:35   CT Head Wo Contrast  Result Date: 06/02/2022 CLINICAL DATA:  Altered mental status EXAM: CT HEAD WITHOUT CONTRAST TECHNIQUE: Contiguous axial images were obtained from the base of the skull through the vertex without intravenous contrast. RADIATION DOSE REDUCTION: This exam was performed according to the departmental dose-optimization program which includes automated exposure control, adjustment of the mA and/or kV according to patient size and/or use of iterative reconstruction technique. COMPARISON:  08/29/2019 FINDINGS: Brain: No evidence of acute infarction, hemorrhage, mass, mass effect, or midline shift. No hydrocephalus or extra-axial fluid collection. Right thalamic lacunar infarct appears chronic but is new compared to 2021. Periventricular white matter changes, likely the sequela of chronic small vessel ischemic disease. Vascular: No hyperdense vessel. Skull: Negative for fracture or focal lesion. Sinuses/Orbits: Mild mucosal thickening in right maxillary sinus. Status post bilateral lens replacements. Other: The mastoid air cells are well aerated. IMPRESSION: No acute intracranial process. Electronically Signed   By: Merilyn Baba M.D.   On: 06/02/2022 23:01   DG Chest 2 View  Result Date: 06/02/2022 CLINICAL DATA:  Altered mental status EXAM: CHEST - 2 VIEW COMPARISON:  07/08/2012 FINDINGS: Heart is borderline in size. Lungs clear. No effusions or edema. No acute bony abnormality. IMPRESSION: No active cardiopulmonary disease. Electronically Signed   By: Rolm Baptise M.D.   On: 06/02/2022 21:12     PHYSICAL EXAM  Temp:  [97.4 F (36.3 C)-99.4 F (37.4 C)] 98.2 F (36.8 C)  (03/07 1145) Pulse Rate:  [57-70] 57 (03/07 1145) Resp:  [16-18] 18 (03/07 1145) BP: (118-133)/(65-80) 131/80 (03/07 1145) SpO2:  [94 %-99 %] 97 % (03/07 1145)  General - Well nourished, well developed, in no apparent distress.  Ophthalmologic - fundi not visualized due to noncooperation.  Cardiovascular - Regular rhythm and rate.  Mental Status -  Level of arousal and orientation to time, place, and person were intact. Language including expression, naming, repetition, comprehension was assessed and found intact.  Cranial Nerves II - XII - II - Visual field showed left upper quadrantanopia. III, IV, VI - Extraocular movements intact. V - Facial sensation intact bilaterally. VII - Facial movement intact bilaterally. VIII - Hearing & vestibular intact bilaterally. X - Palate elevates symmetrically. XI - Chin turning & shoulder shrug intact bilaterally. XII - Tongue protrusion intact.  Motor Strength - The patient's strength was normal in all extremities and pronator drift was absent.  Bulk was normal and fasciculations were absent.   Motor Tone - Muscle tone was assessed at the neck and appendages and was normal.  Reflexes - The patient's reflexes were symmetrical in all extremities and she had no pathological reflexes.  Sensory - Light touch, temperature/pinprick were assessed and were symmetrical.    Coordination - The patient had normal movements in the hands and feet with no ataxia or dysmetria.  Tremor was absent.  Gait and Station - deferred.   ASSESSMENT/PLAN Ms. Casady Khanna is a 82 y.o. female with history of B12 deficiency, arthritis admitted for hallucination, confusion, altered mental status and left hemianopia. No  tPA given due to also window.    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 LDL 137 HgbA1c 5.6 UDS negative SCDs for VTE prophylaxis No antithrombotic prior to admission, now on aspirin 81 mg daily and clopidogrel 75 mg daily DAPT for 3 weeks and then aspirin alone. Patient counseled to be compliant with her antithrombotic medications Ongoing aggressive stroke risk factor management Therapy recommendations: None Disposition: Home.  Recommended no driving at this time given visual deficits, however recommend follow-up with ophthalmology for driving clearance and also consider OT driving rehab. Son is aware.   BP management Home BP monitoring Long term BP goal normotensive  Hyperlipidemia Home meds: None LDL 137, goal < 70 Now on Crestor 20 Continue statin at discharge  Other Stroke Risk Factors Advanced age Obesity, Body mass index is 43.16 kg/m.   Other Active Problems B12 deficiency, on supplement  Hospital day # 0  Neurology will sign off. Please call with questions. Pt will follow up with stroke clinic NP at [facility] in about 4 weeks. Thanks for the consult.   Rosalin Hawking, MD PhD Stroke Neurology 06/04/2022 12:27 PM    To contact Stroke Continuity provider, please refer to [email or web address] After hours, contact General Neurology

## 2022-06-05 ENCOUNTER — Encounter: Payer: Self-pay | Admitting: Family Medicine

## 2022-06-05 MED ORDER — CITALOPRAM HYDROBROMIDE 10 MG PO TABS: 10.0000 mg | ORAL_TABLET | Freq: Every day | ORAL | 2 refills | Status: DC

## 2022-06-05 NOTE — Progress Notes (Unsigned)
CC: difficult behavior concerns for depression/anxiety  Pt recently discharged home. Has family support. Had some incidents with hospital staff. Pt per son who is a physician no change in her exam compared to when at the hospital. Discussed with son behavioral concerns and possible need for a SSRI.   Pt misplaced her plavix and aspirin.   A/P Adjustment disorder Diff is broad given hx of pt prior to this event - SSRI especially Celexa has low SE profile  - reviewed recent EKG and QT normal - no concern for DDI - will f/u with pt in 2-4 wks based on reports from family  Hx of stroke  - plavix and rosuvastatin refilled (verbal)  Benjaman Lobe MD 06/05/22 9:44pm

## 2022-06-07 LAB — VITAMIN B6: Vitamin B6: 9.6 ug/L (ref 3.4–65.2)

## 2022-06-08 LAB — CULTURE, BLOOD (ROUTINE X 2)
Culture: NO GROWTH
Culture: NO GROWTH
Special Requests: ADEQUATE

## 2022-06-09 DIAGNOSIS — H04123 Dry eye syndrome of bilateral lacrimal glands: Secondary | ICD-10-CM | POA: Diagnosis not present

## 2022-06-09 DIAGNOSIS — H26491 Other secondary cataract, right eye: Secondary | ICD-10-CM | POA: Diagnosis not present

## 2022-06-09 DIAGNOSIS — H5213 Myopia, bilateral: Secondary | ICD-10-CM | POA: Diagnosis not present

## 2022-06-09 DIAGNOSIS — H35373 Puckering of macula, bilateral: Secondary | ICD-10-CM | POA: Diagnosis not present

## 2022-06-09 DIAGNOSIS — H5340 Unspecified visual field defects: Secondary | ICD-10-CM | POA: Diagnosis not present

## 2022-06-10 ENCOUNTER — Ambulatory Visit: Payer: Medicare PPO

## 2022-06-17 ENCOUNTER — Ambulatory Visit: Payer: Medicare PPO | Attending: Neurology | Admitting: Physical Therapy

## 2022-06-17 ENCOUNTER — Ambulatory Visit: Payer: Medicare PPO | Admitting: Physical Therapy

## 2022-06-17 DIAGNOSIS — I639 Cerebral infarction, unspecified: Secondary | ICD-10-CM | POA: Insufficient documentation

## 2022-06-17 DIAGNOSIS — R2689 Other abnormalities of gait and mobility: Secondary | ICD-10-CM | POA: Diagnosis not present

## 2022-06-17 DIAGNOSIS — R2681 Unsteadiness on feet: Secondary | ICD-10-CM | POA: Diagnosis not present
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
## 2022-06-23 DIAGNOSIS — I63431 Cerebral infarction due to embolism of right posterior cerebral artery: Secondary | ICD-10-CM | POA: Diagnosis not present

## 2022-06-24 ENCOUNTER — Ambulatory Visit: Payer: Medicare PPO | Admitting: Physical Therapy

## 2022-06-24 DIAGNOSIS — I63431 Cerebral infarction due to embolism of right posterior cerebral artery: Secondary | ICD-10-CM | POA: Diagnosis not present

## 2022-06-27 ENCOUNTER — Other Ambulatory Visit: Payer: Self-pay | Admitting: Family Medicine

## 2022-06-29 ENCOUNTER — Encounter: Payer: Self-pay | Admitting: Occupational Therapy

## 2022-06-29 ENCOUNTER — Ambulatory Visit: Payer: Medicare PPO | Attending: Neurology | Admitting: Occupational Therapy

## 2022-06-29 ENCOUNTER — Ambulatory Visit: Payer: Medicare PPO | Admitting: Physical Therapy

## 2022-06-29 DIAGNOSIS — I63531 Cerebral infarction due to unspecified occlusion or stenosis of right posterior cerebral artery: Secondary | ICD-10-CM | POA: Insufficient documentation

## 2022-06-29 DIAGNOSIS — R41842 Visuospatial deficit: Secondary | ICD-10-CM | POA: Diagnosis not present

## 2022-06-29 DIAGNOSIS — I639 Cerebral infarction, unspecified: Secondary | ICD-10-CM | POA: Insufficient documentation

## 2022-06-29 DIAGNOSIS — R41840 Attention and concentration deficit: Secondary | ICD-10-CM | POA: Diagnosis not present

## 2022-06-29 DIAGNOSIS — R29818 Other symptoms and signs involving the nervous system: Secondary | ICD-10-CM | POA: Insufficient documentation
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
## 2022-06-29 NOTE — Patient Instructions (Signed)
Activities to try at home to encourage visual scanning:   1. Word searches 2. Mazes 3. Puzzles 4. Card games 5. Computer games and/or searches 6. Connect-the-dots  Activities for environmental (larger) scanning:   1. With supervision, scan for items in grocery store or drugstore.  Begin with a familiar store, then progress to a new store you've never been in before. Make sure you have supervision with this.   2. You can play I Spy outside, where you look for items in the environment that are a certain color or start with a certain letter.   **place frequently used items on the left side, sit to mom's left side, sit so that YouTube is on the left side  **Remember you may need to turn head to to left or look up to see all of environment.

## 2022-07-06 ENCOUNTER — Encounter: Payer: Self-pay | Admitting: Occupational Therapy

## 2022-07-07 ENCOUNTER — Ambulatory Visit: Payer: Medicare PPO | Admitting: Physical Therapy

## 2022-07-08 ENCOUNTER — Encounter: Payer: Self-pay | Admitting: Neurology

## 2022-07-08 ENCOUNTER — Ambulatory Visit: Payer: Medicare PPO | Admitting: Neurology

## 2022-07-08 VITALS — BP 134/78 | HR 68 | Ht 62.0 in | Wt 158.0 lb

## 2022-07-08 DIAGNOSIS — E785 Hyperlipidemia, unspecified: Secondary | ICD-10-CM

## 2022-07-08 DIAGNOSIS — H5340 Unspecified visual field defects: Secondary | ICD-10-CM | POA: Diagnosis not present

## 2022-07-08 DIAGNOSIS — I63531 Cerebral infarction due to unspecified occlusion or stenosis of right posterior cerebral artery: Secondary | ICD-10-CM | POA: Diagnosis not present

## 2022-07-08 NOTE — Progress Notes (Signed)
Patient: Natalie Mcfarland Date of Birth: 1940-05-29  Reason for Visit: Follow up History from: Patient, daughter, son  Primary Neurologist: Dr. Pearlean Brownie    ASSESSMENT AND PLAN 82 y.o. year old female   1.  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 Truett Mainland, has the information, they would prefer to discuss options as a family before proceeding  -Continue close follow-up with ophthalmology for visual field deficit, otherwise no other deficits is back to normal  2.  Hyperlipidemia -LDL 137, goal less than 70 -Crestor 20 was added, tolerating well -Will be due for recheck lipid panel with PCP in the next month or so  I will see her back in 6 months or sooner if needed.  We discussed the importance of management of vascular risk factors for secondary stroke prevention with goals of BP less than 130/90, LDL less than 70, A1c less than 7.0.  For any acute stroke symptoms she should go to the ER.  HISTORY OF PRESENT ILLNESS: Today 07/08/22 Natalie Mcfarland is here for stroke clinic follow-up.  Presented to the ER 06/02/2022 with altered mental status.  She had left homonymous hemianopia.  No tPA was given since outside the window.  Found to have right PCA territory infarct including right thalamus.  Embolic pattern.  Highly suspicious for occult A-fib.  Wore Zio patch outpatient 14 days.  No A-fib was seen but showed frequent (162) brief atrial tachycardia episodes.  Seeing ophthalmology Dr. Burgess Estelle.  No other visual deficits except impairment to the left upper quadrant.  She was released by PT, still doing outpatient OT.  Is still taking aspirin and Plavix.  On Crestor without any  side effects.  Started on Celexa for anxiety, was just increased to 20 mg.  She is coping well but the recent stroke and hospitalization has been an adjustment and anxiety provoking to her.  She lives with her husband who is 34.  [facility]  Was driving up until this event.  They had discussed following up with cardiology, but have been undecided.  The option of a loop recorder was presented during the hospital they decided to hold off.  She is accompanied today by her daughter Natalie Mcfarland) who lives in [place], and her son Natalie Mcfarland) who lives in [place].  -CT head no acute abnormality -CTA head and neck right P3/P4 occlusion -MRI of the brain right PCA large infarct involving right thalamus -2D echo -LE venous Doppler no DVT -Family declined loop recorder, Zio patch was placed -LDL 137 -A1c 5.6 -Echo EF 55 to 60%, bubble study was negative -No antithrombotic prior to admission, 3 weeks aspirin 81 mg and Plavix 75 mg daily then aspirin alone -Recommended against driving given visual deficits  HISTORY  06/03/22 Dr. Selina Mcfarland HPI: This is an 82 year old woman who presented to the ED yesterday with altered mental status.  Neurology is consulted for acute ischemic stroke seen on MRI.  Past medical history includes B12 deficiency and osteoarthritis.  Last known well was the day prior.  That evening she had an episode of hallucinations and was confused, believing she was in a different house than she was.  She was unable to locate her bathroom and  went outside and urinated on the deck.  Mental status has improved but is not quite back to baseline.  No prior similar episodes.  No recent illness.  UA negative in ED.  MRI brain showed acute R PCA infarct with thalamic and posterior temporal lobe involvement (personal review). She has no focal neuro deficits other than a L homonymous hemianopsia. Delayed recall 2/3.   REVIEW OF SYSTEMS: Out of a complete 14 system review of symptoms, the patient complains  only of the following symptoms, and all other reviewed systems are negative.  See HPI  ALLERGIES: Allergies  Allergen Reactions   Other Rash    Pt had a rash from a pain medicine but doesn't remember which one    HOME MEDICATIONS: Outpatient Medications Prior to Visit  Medication Sig Dispense Refill   aspirin EC 81 MG tablet Take 1 tablet (81 mg total) by mouth daily. Swallow whole. 30 tablet 12   citalopram (CELEXA) 20 MG tablet Take 20 mg by mouth daily.     clopidogrel (PLAVIX) 75 MG tablet Take 1 tablet (75 mg total) by mouth daily. 90 tablet 1   Cyanocobalamin (VITAMIN DEFICIENCY SYSTEM-B12) 1000 MCG/ML KIT Inject 1,000 mcg as directed every 30 (thirty) days.     loratadine (CLARITIN) 10 MG tablet Take 10 mg by mouth daily.     rosuvastatin (CRESTOR) 20 MG tablet Take 1 tablet (20 mg total) by mouth daily. 90 tablet 1   citalopram (CELEXA) 10 MG tablet Take 1 tablet (10 mg total) by mouth at bedtime. 30 tablet 2   No facility-administered medications prior to visit.    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  SOCIAL HISTORY: Social History   Socioeconomic History   Marital status: Married    Spouse name: Not on file   Number of children: Not on file   Years of education: Not on file   Highest education level: Not on file  Occupational History   Not on file  Tobacco Use   Smoking status: Never   Smokeless  tobacco: Never  Substance and Sexual Activity   Alcohol use: No   Drug use: No   Sexual activity: Not on file  Other Topics Concern   Not on file  Social History Narrative   Not on file   Social Determinants of Health   Financial Resource Strain: Not on file  Food Insecurity: Not on file  Transportation Needs: Not on file  Physical Activity: Not on file  Stress: Not on file  Social Connections: Not on file  Intimate Partner Violence: Not on file   PHYSICAL EXAM  Vitals:   07/08/22 1014  BP: 134/78  Pulse: 68  Weight: 158 lb (71.7 kg)  Height: 5\' 2"  (1.575 m)   Body mass index is 28.9 kg/m.  Generalized: Well developed, in no acute distress  Neurological examination  Mentation: Alert oriented to time,  place, history taking. Follows all commands speech and language fluent Cranial nerve II-XII: Pupils were equal round reactive to light. Extraocular movements were full, visual field were full on confrontational test.  No visual field deficits were reported, but did have difficulty looking straight ahead for testing, appears to be some impairment to left upper quadrantanopia. Facial sensation and strength were normal.  Head turning and shoulder shrug  were normal and symmetric. Motor: The motor testing reveals 5 over 5 strength of all 4 extremities. Good symmetric motor tone is noted throughout.  Sensory: Sensory testing is intact to soft touch on all 4 extremities. No evidence of extinction is noted.  Coordination: Cerebellar testing reveals good finger-nose-finger and heel-to-shin bilaterally.  Gait and station: Gait is slightly wide-based, but steady, can walk independently in the room, has a cane for hallway walking Reflexes: Deep tendon reflexes are symmetric and normal bilaterally.   DIAGNOSTIC DATA (LABS, IMAGING, TESTING) - I reviewed patient records, labs, notes, testing and imaging myself where available.  Lab Results  Component Value Date   WBC 7.7 06/02/2022    HGB 12.3 06/02/2022   HCT 36.5 06/02/2022   MCV 93.4 06/02/2022   PLT 169 06/02/2022      Component Value Date/Time   NA 137 06/04/2022 1030   NA 141 09/22/2021 0000   K 3.4 (L) 06/04/2022 1030   CL 106 06/04/2022 1030   CO2 26 06/04/2022 1030   GLUCOSE 127 (H) 06/04/2022 1030   BUN 12 06/04/2022 1030   BUN 15 09/22/2021 0000   CREATININE 0.68 06/04/2022 1030   CALCIUM 8.8 (L) 06/04/2022 1030   PROT 6.7 06/04/2022 1030   PROT 7.3 09/22/2021 0000   ALBUMIN 3.5 06/04/2022 1030   ALBUMIN 4.3 09/22/2021 0000   AST 28 06/04/2022 1030   ALT 31 06/04/2022 1030   ALKPHOS 35 (L) 06/04/2022 1030   BILITOT 0.9 06/04/2022 1030   BILITOT 0.4 09/22/2021 0000   GFRNONAA >60 06/04/2022 1030   GFRAA >90 07/27/2012 0420   Lab Results  Component Value Date   LDLDIRECT 137 (H) 06/03/2022   Lab Results  Component Value Date   HGBA1C 5.6 06/03/2022   Lab Results  Component Value Date   VITAMINB12 >2000 (H) 05/20/2022   Lab Results  Component Value Date   TSH 1.076 06/03/2022    Margie Ege, AGNP-C, DNP 07/08/2022, 11:37 AM [facility] [address] [place], [place] [address] [phone]

## 2022-07-08 NOTE — Patient Instructions (Signed)
You can stop the Plavix, continue the aspirin 81 mg daily for stroke prevention  Keep BP < 130/90, LDL < 70, A1C < 7.0  See Dr. Burgess Estelle this afternoon   Consider Loop recorder, follow up with cardiology , let me know I can place a referral: Patwardhan, Anabel Bene, MD   Follow up in 6 months

## 2022-07-09 ENCOUNTER — Encounter: Payer: Self-pay | Admitting: Occupational Therapy

## 2022-07-13 ENCOUNTER — Ambulatory Visit: Payer: Medicare PPO | Admitting: Physical Therapy

## 2022-07-13 ENCOUNTER — Encounter: Payer: Medicare PPO | Admitting: Rehabilitative and Restorative Service Providers"

## 2022-07-27 ENCOUNTER — Ambulatory Visit: Payer: Medicare PPO | Admitting: Occupational Therapy

## 2022-07-28 NOTE — Therapy (Addendum)
OCCUPATIONAL THERAPY DISCHARGE SUMMARY   Patient is being discharged due to the patient's request.. Pt states she is "feeling better".

## 2022-07-30 ENCOUNTER — Other Ambulatory Visit (HOSPITAL_BASED_OUTPATIENT_CLINIC_OR_DEPARTMENT_OTHER): Payer: Self-pay | Admitting: Family Medicine

## 2022-07-30 ENCOUNTER — Encounter (HOSPITAL_BASED_OUTPATIENT_CLINIC_OR_DEPARTMENT_OTHER): Payer: Self-pay

## 2022-07-30 ENCOUNTER — Other Ambulatory Visit: Payer: Self-pay | Admitting: Family Medicine

## 2022-07-30 ENCOUNTER — Ambulatory Visit (HOSPITAL_BASED_OUTPATIENT_CLINIC_OR_DEPARTMENT_OTHER)
Admission: RE | Admit: 2022-07-30 | Discharge: 2022-07-30 | Disposition: A | Discharge status: Home / Self Care | Payer: Medicare PPO | Source: Ambulatory Visit | Attending: Family Medicine | Admitting: Family Medicine

## 2022-07-30 ENCOUNTER — Other Ambulatory Visit (HOSPITAL_COMMUNITY): Payer: Self-pay | Admitting: Family Medicine

## 2022-07-30 DIAGNOSIS — G464 Cerebellar stroke syndrome: Secondary | ICD-10-CM

## 2022-07-30 DIAGNOSIS — Z510 Encounter for antineoplastic radiation therapy: Secondary | ICD-10-CM | POA: Diagnosis not present

## 2022-07-30 DIAGNOSIS — R29818 Other symptoms and signs involving the nervous system: Secondary | ICD-10-CM | POA: Diagnosis not present

## 2022-07-30 DIAGNOSIS — R413 Other amnesia: Secondary | ICD-10-CM | POA: Diagnosis not present

## 2022-07-31 LAB — CBC WITH DIFFERENTIAL/PLATELET
Basophils Absolute: 0 10*3/uL (ref 0.0–0.2)
Basos: 0 %
EOS (ABSOLUTE): 0.2 10*3/uL (ref 0.0–0.4)
Eos: 3 %
Hematocrit: 38.4 % (ref 34.0–46.6)
Hemoglobin: 12.6 g/dL (ref 11.1–15.9)
Immature Grans (Abs): 0 10*3/uL (ref 0.0–0.1)
Immature Granulocytes: 0 %
Lymphocytes Absolute: 1.4 10*3/uL (ref 0.7–3.1)
Lymphs: 32 %
MCH: 31.7 pg (ref 26.6–33.0)
MCHC: 32.8 g/dL (ref 31.5–35.7)
MCV: 97 fL (ref 79–97)
Monocytes Absolute: 0.4 10*3/uL (ref 0.1–0.9)
Monocytes: 9 %
Neutrophils Absolute: 2.5 10*3/uL (ref 1.4–7.0)
Neutrophils: 56 %
Platelets: 176 10*3/uL (ref 150–450)
RBC: 3.98 x10E6/uL (ref 3.77–5.28)
RDW: 12.4 % (ref 11.7–15.4)
WBC: 4.5 10*3/uL (ref 3.4–10.8)

## 2022-07-31 LAB — COMPREHENSIVE METABOLIC PANEL
ALT: 15 IU/L (ref 0–32)
AST: 20 IU/L (ref 0–40)
Albumin/Globulin Ratio: 1.7 (ref 1.2–2.2)
Albumin: 4.4 g/dL (ref 3.7–4.7)
Alkaline Phosphatase: 44 IU/L (ref 44–121)
BUN/Creatinine Ratio: 22 (ref 12–28)
BUN: 15 mg/dL (ref 8–27)
Bilirubin Total: 0.4 mg/dL (ref 0.0–1.2)
CO2: 20 mmol/L (ref 20–29)
Calcium: 9.1 mg/dL (ref 8.7–10.3)
Chloride: 103 mmol/L (ref 96–106)
Creatinine, Ser: 0.67 mg/dL (ref 0.57–1.00)
Globulin, Total: 2.6 g/dL (ref 1.5–4.5)
Glucose: 111 mg/dL — ABNORMAL HIGH (ref 70–99)
Potassium: 4.7 mmol/L (ref 3.5–5.2)
Sodium: 141 mmol/L (ref 134–144)
Total Protein: 7 g/dL (ref 6.0–8.5)
eGFR: 87 mL/min/{1.73_m2} (ref >59)

## 2022-07-31 LAB — LIPID PANEL W/O CHOL/HDL RATIO
Cholesterol, Total: 135 mg/dL (ref 100–199)
HDL: 59 mg/dL (ref >39)
LDL Chol Calc (NIH): 59 mg/dL (ref 0–99)
Triglycerides: 90 mg/dL (ref 0–149)
VLDL Cholesterol Cal: 17 mg/dL (ref 5–40)

## 2022-07-31 LAB — B12 AND FOLATE PANEL
Folate: 17.4 ng/mL (ref >3.0)
Vitamin B-12: >2000 pg/mL — ABNORMAL HIGH (ref 232–1245)

## 2022-08-03 ENCOUNTER — Encounter: Payer: Medicare PPO | Admitting: Occupational Therapy

## 2022-08-10 ENCOUNTER — Encounter: Payer: Medicare PPO | Admitting: Occupational Therapy

## 2022-08-11 ENCOUNTER — Encounter: Payer: Medicare PPO | Admitting: Occupational Therapy

## 2022-08-17 ENCOUNTER — Encounter: Payer: Medicare PPO | Admitting: Occupational Therapy

## 2022-08-18 ENCOUNTER — Encounter: Payer: Medicare PPO | Admitting: Occupational Therapy

## 2022-09-29 ENCOUNTER — Other Ambulatory Visit: Payer: Self-pay

## 2022-09-29 DIAGNOSIS — R7309 Other abnormal glucose: Secondary | ICD-10-CM | POA: Diagnosis not present

## 2022-09-30 LAB — HGB A1C W/O EAG: Hgb A1c MFr Bld: 5.4 % (ref 4.8–5.6)

## 2022-10-08 DIAGNOSIS — M26623 Arthralgia of bilateral temporomandibular joint: Secondary | ICD-10-CM | POA: Diagnosis not present

## 2022-10-08 DIAGNOSIS — R42 Dizziness and giddiness: Secondary | ICD-10-CM | POA: Diagnosis not present

## 2022-10-08 DIAGNOSIS — Z8673 Personal history of transient ischemic attack (TIA), and cerebral infarction without residual deficits: Secondary | ICD-10-CM | POA: Diagnosis not present

## 2022-11-03 DIAGNOSIS — H04123 Dry eye syndrome of bilateral lacrimal glands: Secondary | ICD-10-CM | POA: Diagnosis not present

## 2022-11-03 DIAGNOSIS — H5340 Unspecified visual field defects: Secondary | ICD-10-CM | POA: Diagnosis not present

## 2022-11-19 DIAGNOSIS — M25561 Pain in right knee: Secondary | ICD-10-CM | POA: Diagnosis not present

## 2022-12-03 ENCOUNTER — Other Ambulatory Visit: Payer: Self-pay

## 2022-12-03 DIAGNOSIS — G464 Cerebellar stroke syndrome: Secondary | ICD-10-CM | POA: Diagnosis not present

## 2022-12-03 DIAGNOSIS — D510 Vitamin B12 deficiency anemia due to intrinsic factor deficiency: Secondary | ICD-10-CM | POA: Diagnosis not present

## 2022-12-04 LAB — B12 AND FOLATE PANEL
Folate: 13.4 ng/mL (ref >3.0)
Vitamin B-12: >2000 pg/mL — ABNORMAL HIGH (ref 232–1245)

## 2022-12-31 DIAGNOSIS — M170 Bilateral primary osteoarthritis of knee: Secondary | ICD-10-CM | POA: Diagnosis not present

## 2023-01-13 ENCOUNTER — Ambulatory Visit: Payer: Medicare PPO | Admitting: Neurology

## 2023-02-01 DIAGNOSIS — G4700 Insomnia, unspecified: Secondary | ICD-10-CM | POA: Diagnosis not present

## 2023-02-01 DIAGNOSIS — F0390 Unspecified dementia without behavioral disturbance: Secondary | ICD-10-CM | POA: Diagnosis not present

## 2023-02-19 ENCOUNTER — Other Ambulatory Visit: Payer: Self-pay | Admitting: Family Medicine

## 2023-02-19 DIAGNOSIS — R270 Ataxia, unspecified: Secondary | ICD-10-CM

## 2023-02-19 DIAGNOSIS — R413 Other amnesia: Secondary | ICD-10-CM

## 2023-02-19 DIAGNOSIS — R42 Dizziness and giddiness: Secondary | ICD-10-CM

## 2023-03-12 ENCOUNTER — Other Ambulatory Visit: Payer: Medicare PPO

## 2023-03-23 IMAGING — MR MR THORACIC SPINE W/O CM
5 series · 42 of 48 positions shown · non-contrast
Comparison: None Available.

CLINICAL DATA: Status post fall 3 weeks ago.

EXAM:
MRI THORACIC SPINE WITHOUT CONTRAST
TECHNIQUE: Multiplanar, multisequence MR imaging of the thoracic spine was
performed. No intravenous contrast was administered.

[Series 5: T2 · sagittal · 3.0mm · 1.06mm/px · 6 of 15 slices shown (1 of 2)]
[im 1/15]
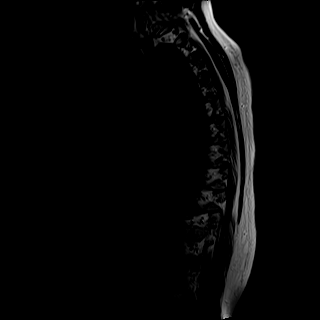
[im 3/15]
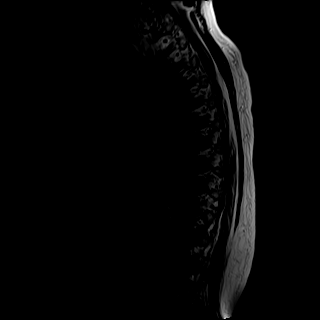
[im 6/15]
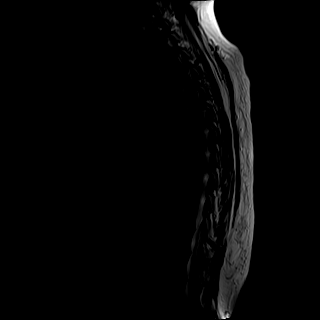
[im 9/15]
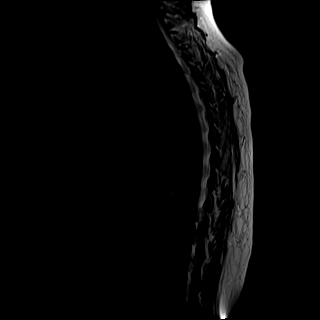
[im 12/15]
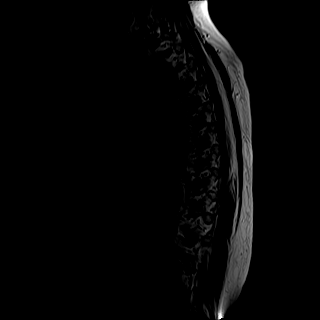
[im 15/15]
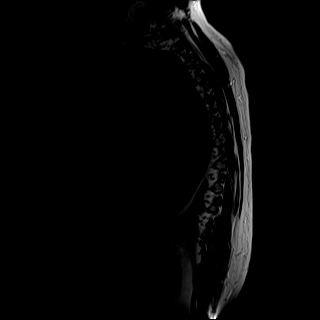

[Series 6: T1 · sagittal · 3.0mm · 1.06mm/px · 6 of 15 slices shown]
[im 1/15]
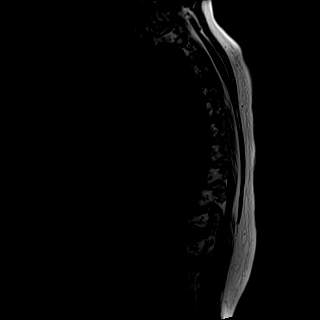
[im 3/15]
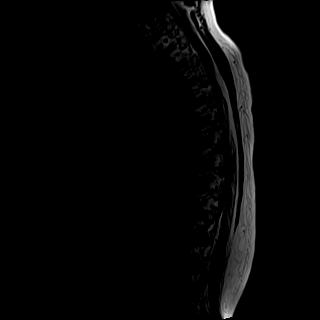
[im 6/15]
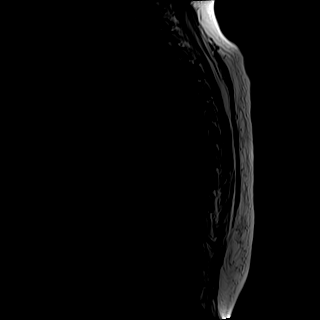
[im 9/15]
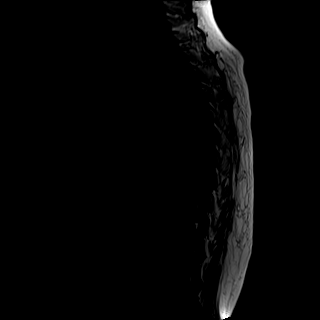
[im 12/15]
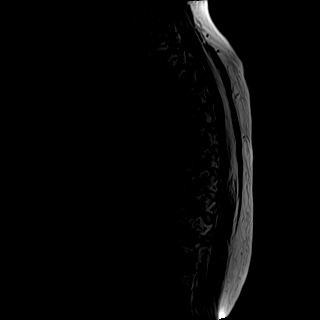
[im 15/15]
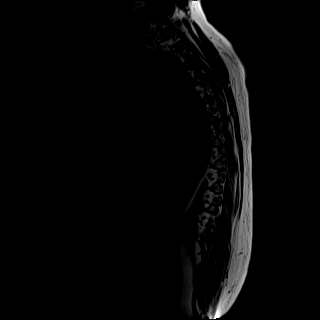

[Series 7: STIR · sagittal · 3.0mm · 1.06mm/px · 6 of 15 slices shown]
[im 1/15]
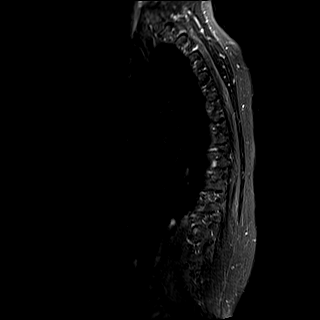
[im 3/15]
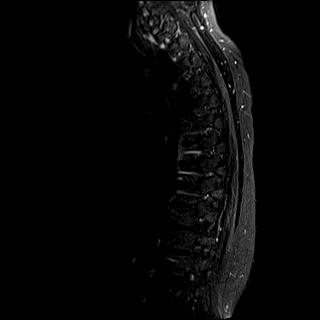
[im 6/15]
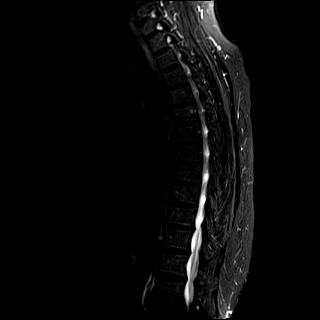
[im 9/15]
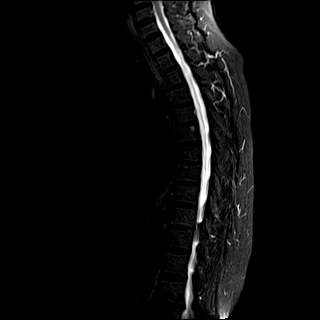
[im 12/15]
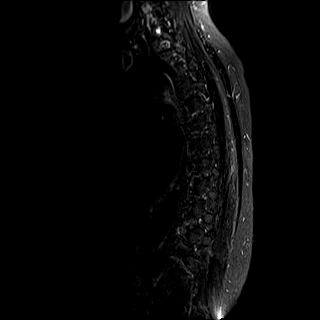
[im 15/15]
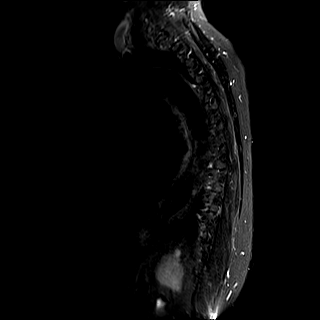

[Series 8: T2 · axial · 5.0mm · 0.78mm/px · z∈[-293,-89]mm · 15 of 39 slices shown (2 of 2)]
[im 1/39]
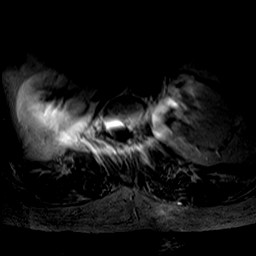
[im 3/39]
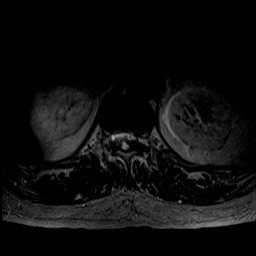
[im 6/39]
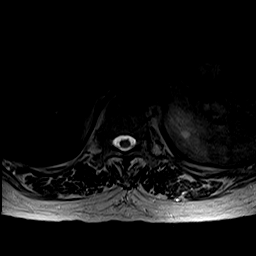
[im 9/39]
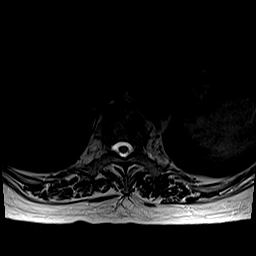
[im 11/39]
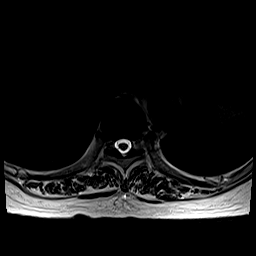
[im 14/39]
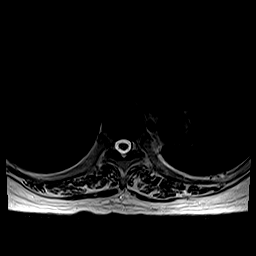
[im 17/39]
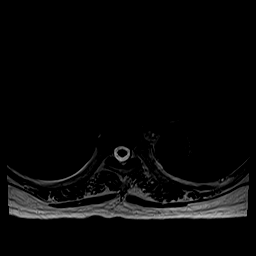
[im 20/39]
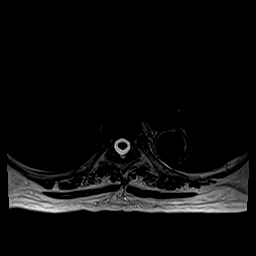
[im 22/39]
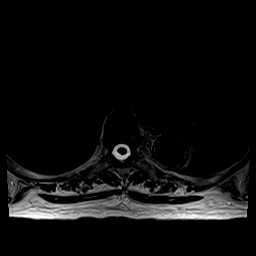
[im 25/39]
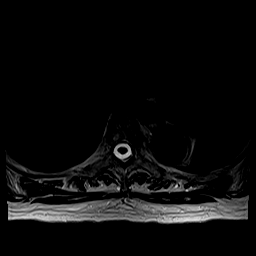
[im 28/39]
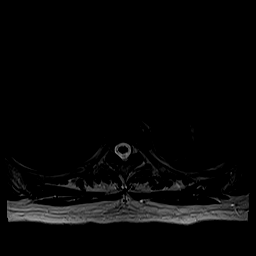
[im 30/39]
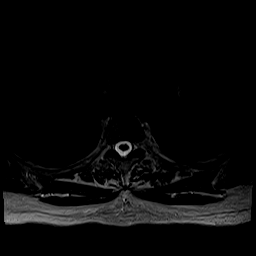
[im 33/39]
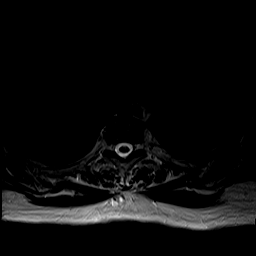
[im 36/39]
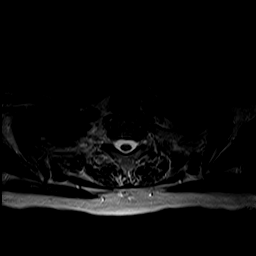
[im 39/39]
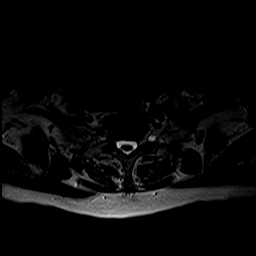

[Series 9: ax mpgr · axial · 5.0mm · 0.39mm/px · z∈[-296,-76]mm · 9 of 39 slices shown]
[im 1/39]
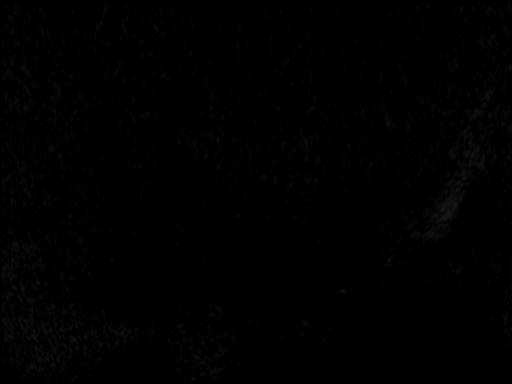
[im 3/39]
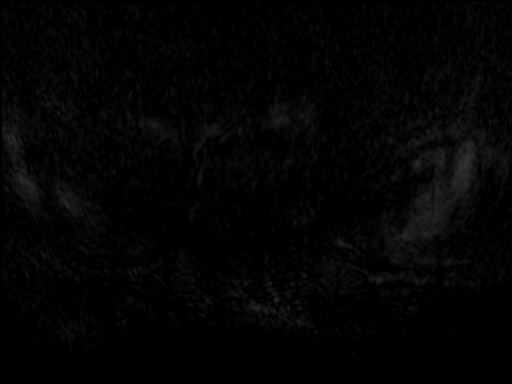
[im 6/39]
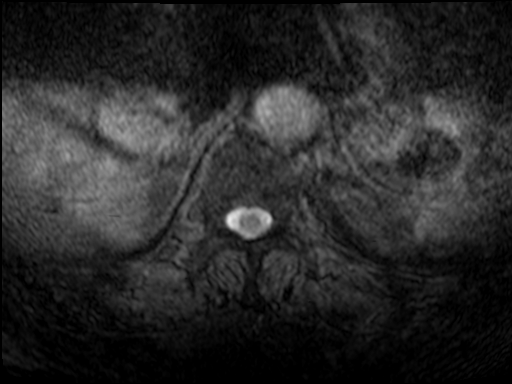
[im 11/39]
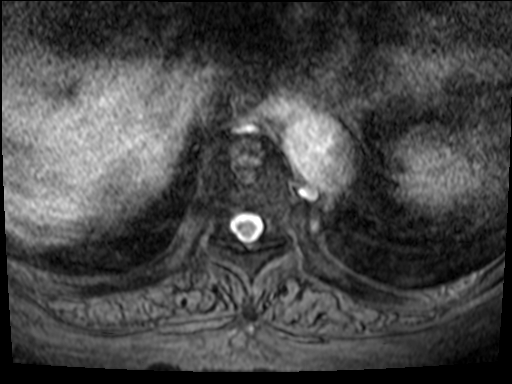
[im 17/39]
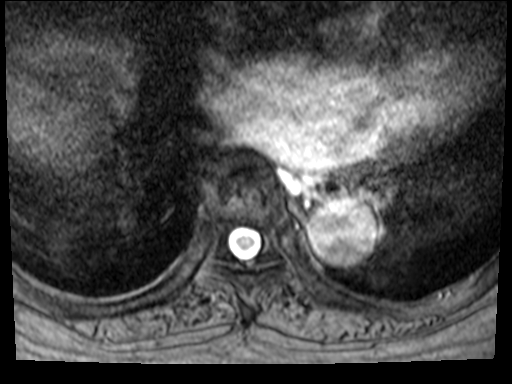
[im 22/39]
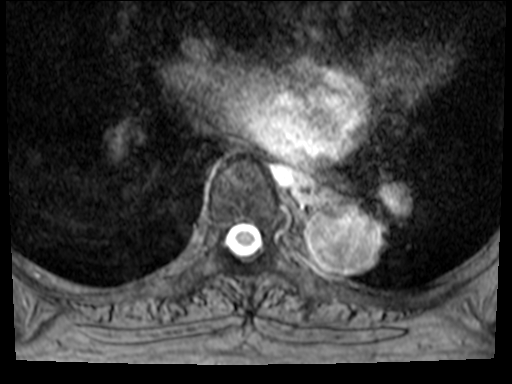
[im 28/39]
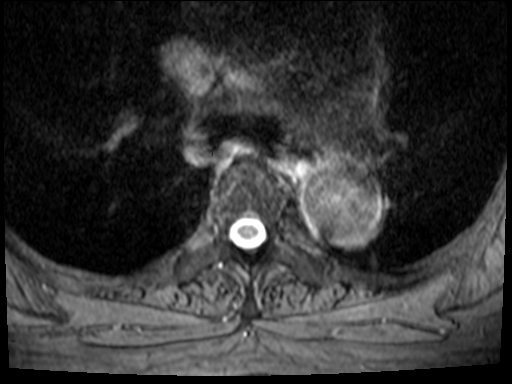
[im 33/39]
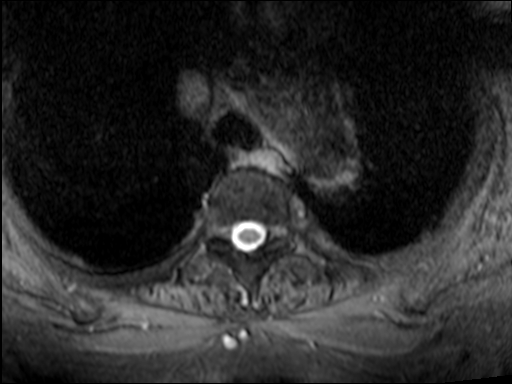
[im 39/39]
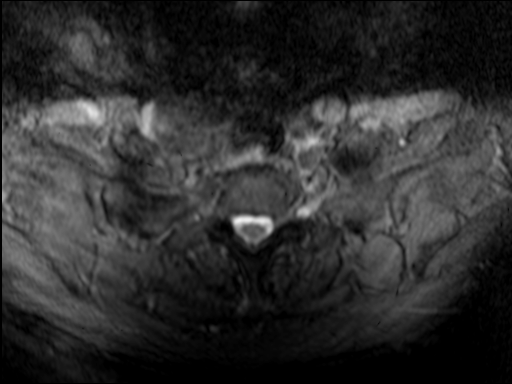

[42 of 48 positions shown; findings below may reference images not displayed]

FINDINGS: Alignment:  Physiologic.

Vertebrae: No acute fracture, evidence of discitis, or aggressive
bone lesion.

Cord:  Normal signal and morphology.

Paraspinal and other soft tissues: No acute paraspinal abnormality.

Disc levels:

Disc spaces:  Disc desiccation throughout the thoracic spine.

C5-6: Mild broad-based disc bulge.

C6-7: Mild broad-based disc bulge. Foraminal or central canal
stenosis.

T1-T2: No disc protrusion, foraminal stenosis or central canal
stenosis.

T2-T3: No disc protrusion, foraminal stenosis or central canal
stenosis.

T3-T4: No disc protrusion, foraminal stenosis or central canal
stenosis.

T4-T5: No disc protrusion, foraminal stenosis or central canal
stenosis.

T5-T6: No disc protrusion, foraminal stenosis or central canal
stenosis.

T6-T7: No disc protrusion, foraminal stenosis or central canal
stenosis.

T7-T8: No disc protrusion, foraminal stenosis or central canal
stenosis.

T8-T9: No disc protrusion, foraminal stenosis or central canal
stenosis.

T9-T10: No disc protrusion, foraminal stenosis or central canal
stenosis.

T10-T11: Mild broad-based disc bulge. Mild spinal stenosis. Mild
bilateral facet arthropathy. Mild bilateral foraminal stenosis.

T11-T12: Mild broad-based disc bulge. Mild spinal stenosis. No
foraminal or central canal stenosis.

T12-L1: Mild broad-based disc bulge. No foraminal or central canal
stenosis.
IMPRESSION: 1. No acute osseous injury of the thoracic spine.
2. Mild thoracic spine spondylosis as described above.

## 2023-04-28 ENCOUNTER — Other Ambulatory Visit: Payer: Medicare PPO

## 2023-05-11 DIAGNOSIS — M1711 Unilateral primary osteoarthritis, right knee: Secondary | ICD-10-CM | POA: Diagnosis not present

## 2023-05-11 DIAGNOSIS — Z96652 Presence of left artificial knee joint: Secondary | ICD-10-CM | POA: Diagnosis not present

## 2023-05-11 DIAGNOSIS — Z471 Aftercare following joint replacement surgery: Secondary | ICD-10-CM | POA: Diagnosis not present

## 2023-05-26 DIAGNOSIS — F323 Major depressive disorder, single episode, severe with psychotic features: Secondary | ICD-10-CM | POA: Diagnosis not present

## 2023-06-10 DIAGNOSIS — M1712 Unilateral primary osteoarthritis, left knee: Secondary | ICD-10-CM | POA: Diagnosis not present

## 2023-06-10 DIAGNOSIS — M1711 Unilateral primary osteoarthritis, right knee: Secondary | ICD-10-CM | POA: Diagnosis not present

## 2023-06-10 DIAGNOSIS — M170 Bilateral primary osteoarthritis of knee: Secondary | ICD-10-CM | POA: Diagnosis not present

## 2023-06-10 DIAGNOSIS — Z96652 Presence of left artificial knee joint: Secondary | ICD-10-CM | POA: Diagnosis not present

## 2023-06-14 ENCOUNTER — Encounter: Payer: Self-pay | Admitting: Family Medicine

## 2023-06-16 ENCOUNTER — Ambulatory Visit
Admission: RE | Admit: 2023-06-16 | Discharge: 2023-06-16 | Disposition: A | Discharge status: Home / Self Care | Payer: Medicare PPO | Source: Ambulatory Visit | Attending: Family Medicine | Admitting: Family Medicine

## 2023-06-16 DIAGNOSIS — I63531 Cerebral infarction due to unspecified occlusion or stenosis of right posterior cerebral artery: Secondary | ICD-10-CM | POA: Diagnosis not present

## 2023-06-16 DIAGNOSIS — R42 Dizziness and giddiness: Secondary | ICD-10-CM

## 2023-06-16 DIAGNOSIS — R413 Other amnesia: Secondary | ICD-10-CM

## 2023-06-16 DIAGNOSIS — I6782 Cerebral ischemia: Secondary | ICD-10-CM | POA: Diagnosis not present

## 2023-06-16 DIAGNOSIS — R270 Ataxia, unspecified: Secondary | ICD-10-CM

## 2023-07-15 DIAGNOSIS — M1711 Unilateral primary osteoarthritis, right knee: Secondary | ICD-10-CM | POA: Diagnosis not present

## 2023-07-21 DIAGNOSIS — N393 Stress incontinence (female) (male): Secondary | ICD-10-CM | POA: Diagnosis not present

## 2023-07-22 DIAGNOSIS — M1711 Unilateral primary osteoarthritis, right knee: Secondary | ICD-10-CM | POA: Diagnosis not present

## 2023-07-29 DIAGNOSIS — M1711 Unilateral primary osteoarthritis, right knee: Secondary | ICD-10-CM | POA: Diagnosis not present

## 2023-08-19 DIAGNOSIS — F323 Major depressive disorder, single episode, severe with psychotic features: Secondary | ICD-10-CM | POA: Diagnosis not present

## 2023-10-27 DIAGNOSIS — F323 Major depressive disorder, single episode, severe with psychotic features: Secondary | ICD-10-CM | POA: Diagnosis not present

## 2024-01-06 DIAGNOSIS — D510 Vitamin B12 deficiency anemia due to intrinsic factor deficiency: Secondary | ICD-10-CM | POA: Diagnosis not present

## 2024-01-06 DIAGNOSIS — R42 Dizziness and giddiness: Secondary | ICD-10-CM | POA: Diagnosis not present

## 2024-01-24 DIAGNOSIS — F323 Major depressive disorder, single episode, severe with psychotic features: Secondary | ICD-10-CM | POA: Diagnosis not present

## 2024-02-29 DIAGNOSIS — R55 Syncope and collapse: Secondary | ICD-10-CM | POA: Diagnosis not present

## 2024-02-29 DIAGNOSIS — R42 Dizziness and giddiness: Secondary | ICD-10-CM | POA: Diagnosis not present

## 2024-02-29 DIAGNOSIS — D510 Vitamin B12 deficiency anemia due to intrinsic factor deficiency: Secondary | ICD-10-CM | POA: Diagnosis not present
